# Patient Record
Sex: Female | Born: 1970 | Race: White | Hispanic: No | Marital: Single | Smoking: Never smoker
Health system: Southern US, Community
[De-identification: ages and names within clinical notes are randomized; demographics above are authoritative.]

## PROBLEM LIST (undated history)

## (undated) DIAGNOSIS — F32A Depression, unspecified: Secondary | ICD-10-CM

## (undated) DIAGNOSIS — L680 Hirsutism: Secondary | ICD-10-CM

## (undated) DIAGNOSIS — E785 Hyperlipidemia, unspecified: Secondary | ICD-10-CM

## (undated) DIAGNOSIS — E039 Hypothyroidism, unspecified: Secondary | ICD-10-CM

## (undated) DIAGNOSIS — F329 Major depressive disorder, single episode, unspecified: Secondary | ICD-10-CM

## (undated) DIAGNOSIS — J302 Other seasonal allergic rhinitis: Secondary | ICD-10-CM

## (undated) HISTORY — DX: Hirsutism: L68.0

## (undated) HISTORY — DX: Other seasonal allergic rhinitis: J30.2

## (undated) HISTORY — PX: NASAL SINUS SURGERY: SHX719

## (undated) HISTORY — DX: Depression, unspecified: F32.A

## (undated) HISTORY — DX: Hyperlipidemia, unspecified: E78.5

## (undated) HISTORY — DX: Major depressive disorder, single episode, unspecified: F32.9

## (undated) HISTORY — DX: Hypothyroidism, unspecified: E03.9

---

## 1999-02-17 ENCOUNTER — Encounter: Payer: Self-pay | Admitting: Family Medicine

## 1999-02-17 ENCOUNTER — Ambulatory Visit (HOSPITAL_COMMUNITY): Admission: RE | Admit: 1999-02-17 | Discharge: 1999-02-17 | Payer: Self-pay | Admitting: Family Medicine

## 2005-02-21 ENCOUNTER — Other Ambulatory Visit: Admission: RE | Admit: 2005-02-21 | Discharge: 2005-02-21 | Payer: Self-pay | Admitting: Obstetrics and Gynecology

## 2008-04-26 ENCOUNTER — Encounter: Admission: RE | Admit: 2008-04-26 | Discharge: 2008-04-26 | Payer: Self-pay | Admitting: Family Medicine

## 2009-05-05 ENCOUNTER — Ambulatory Visit: Payer: Self-pay | Admitting: Internal Medicine

## 2009-05-05 DIAGNOSIS — E785 Hyperlipidemia, unspecified: Secondary | ICD-10-CM | POA: Insufficient documentation

## 2009-05-05 DIAGNOSIS — F32A Depression, unspecified: Secondary | ICD-10-CM | POA: Insufficient documentation

## 2009-05-05 DIAGNOSIS — F329 Major depressive disorder, single episode, unspecified: Secondary | ICD-10-CM

## 2009-07-26 LAB — CONVERTED CEMR LAB: Pap Smear: NORMAL

## 2009-08-18 ENCOUNTER — Ambulatory Visit: Payer: Self-pay | Admitting: Internal Medicine

## 2009-08-18 DIAGNOSIS — E669 Obesity, unspecified: Secondary | ICD-10-CM | POA: Insufficient documentation

## 2009-08-22 LAB — CONVERTED CEMR LAB
ALT: 17 units/L (ref 0–35)
AST: 23 units/L (ref 0–37)
Albumin: 4.1 g/dL (ref 3.5–5.2)
Alkaline Phosphatase: 70 units/L (ref 39–117)
BUN: 14 mg/dL (ref 6–23)
Basophils Absolute: 0 10*3/uL (ref 0.0–0.1)
Basophils Relative: 0.5 % (ref 0.0–3.0)
Bilirubin, Direct: 0 mg/dL (ref 0.0–0.3)
CO2: 26 meq/L (ref 19–32)
Calcium: 9.4 mg/dL (ref 8.4–10.5)
Chloride: 103 meq/L (ref 96–112)
Cholesterol: 209 mg/dL — ABNORMAL HIGH (ref 0–200)
Creatinine, Ser: 0.8 mg/dL (ref 0.4–1.2)
Direct LDL: 130.5 mg/dL
Eosinophils Absolute: 0.3 10*3/uL (ref 0.0–0.7)
Eosinophils Relative: 5 % (ref 0.0–5.0)
GFR calc non Af Amer: 84.72 mL/min (ref >60)
Glucose, Bld: 89 mg/dL (ref 70–99)
HCT: 42 % (ref 36.0–46.0)
HDL: 67.1 mg/dL (ref >39.00)
Hemoglobin: 14 g/dL (ref 12.0–15.0)
Lymphocytes Relative: 16.8 % (ref 12.0–46.0)
Lymphs Abs: 1 10*3/uL (ref 0.7–4.0)
MCHC: 33.4 g/dL (ref 30.0–36.0)
MCV: 93.6 fL (ref 78.0–100.0)
Monocytes Absolute: 0.3 10*3/uL (ref 0.1–1.0)
Monocytes Relative: 5.9 % (ref 3.0–12.0)
Neutro Abs: 4.3 10*3/uL (ref 1.4–7.7)
Neutrophils Relative %: 71.8 % (ref 43.0–77.0)
Platelets: 277 10*3/uL (ref 150.0–400.0)
Potassium: 4.1 meq/L (ref 3.5–5.1)
RBC: 4.48 M/uL (ref 3.87–5.11)
RDW: 11.1 % — ABNORMAL LOW (ref 11.5–14.6)
Sodium: 138 meq/L (ref 135–145)
TSH: 3.09 microintl units/mL (ref 0.35–5.50)
Total Bilirubin: 0.3 mg/dL (ref 0.3–1.2)
Total CHOL/HDL Ratio: 3
Total Protein: 7.5 g/dL (ref 6.0–8.3)
Triglycerides: 159 mg/dL — ABNORMAL HIGH (ref 0.0–149.0)
VLDL: 31.8 mg/dL (ref 0.0–40.0)
WBC: 5.9 10*3/uL (ref 4.5–10.5)

## 2009-08-23 ENCOUNTER — Telehealth: Payer: Self-pay | Admitting: Internal Medicine

## 2009-09-15 ENCOUNTER — Encounter: Payer: Self-pay | Admitting: Internal Medicine

## 2010-04-24 ENCOUNTER — Telehealth: Payer: Self-pay | Admitting: Internal Medicine

## 2010-05-28 LAB — HM PAP SMEAR: HM Pap smear: NORMAL

## 2010-06-27 NOTE — Progress Notes (Signed)
Summary: refill  Phone Note Refill Request Call back at Home Phone [phone] Call back at Work Phone [phone] Message from:  Patient---live call  Refills Requested: Medication #1:  LIPITOR 20 MG TABS Take 1 tablet by mouth once a day  Medication #2:  SERTRALINE HCL 50 MG TABS Take 1 tablet by mouth once a day. [facility]  Initial call taken by: Warnell Forester,  April 24, 2010 4:10 PM  Follow-up for Phone Call        Rx called to pharmacy Follow-up by: Alfred Levins, CMA,  April 25, 2010 8:15 AM    Prescriptions: SERTRALINE HCL 50 MG TABS (SERTRALINE HCL) Take 1 tablet by mouth once a day  #90 x 0   Entered by:   Alfred Levins, CMA   Authorized by:   Birdie Sons MD   Signed by:   Alfred Levins, CMA on 04/25/2010   Method used:   Electronically to        [facility])       [facility]       [address]       [place], [place]  [address]       Ph: [phone]       Fax: [phone]   RxID:   [id number] LIPITOR 20 MG TABS (ATORVASTATIN CALCIUM) Take 1 tablet by mouth once a day  #90 x 0   Entered by:   Alfred Levins, CMA   Authorized by:   Birdie Sons MD   Signed by:   Alfred Levins, CMA on 04/25/2010   Method used:   Electronically to        [facility] ([facility]order)       [facility]       [address]       [place], [place]  [address]       Ph: [phone]       Fax: [phone]   RxID:   [id number]

## 2010-06-27 NOTE — Progress Notes (Signed)
Summary: request  Phone Note Call from Patient Call back at Home Phone [phone] Call back at Work Phone [phone]   Caller: vm Mon 3:59 Call For: ellen Summary of Call: Returning your call.  Specific questions about labs & referral to [facility] that Dr. Armanda Magic said he'd do for thyroid.   Initial call taken by: Rudy Jew, RN,  August 23, 2009 8:25 AM  Follow-up for Phone Call        Requested specific lab values which were relayed.  She also requests referral to Dr Talmage Nap at [facility]. Follow-up by: Gladis Riffle, RN,  August 23, 2009 9:40 AM  Additional Follow-up for Phone Call Additional follow up Details #1::        she can call for own appt... we will send labs Additional Follow-up by: Birdie Sons MD,  August 23, 2009 9:54 AM    Additional Follow-up for Phone Call Additional follow up Details #2::    Patient notified.   Labs to be faxed. Follow-up by: Gladis Riffle, RN,  August 23, 2009 12:01 PM  Additional Follow-up for Phone Call Additional follow up Details #3:: Details for Additional Follow-up Action Taken: pt called back and says for appt we need to send ov notes, labs, demographics, and insurance info faxed  to attn: jessie at [phone]  ok per dr swords to share info but no referral indicated.Patient notified.   Info will be faxed as requested. Additional Follow-up by: Gladis Riffle, RN,  August 23, 2009 2:48 PM

## 2010-06-27 NOTE — Consult Note (Signed)
Summary: [facility]   Imported By: Maryln Gottron 10/18/2009 10:53:09  _____________________________________________________________________  External Attachment:    Type:   Image     Comment:   External Document

## 2010-06-27 NOTE — Assessment & Plan Note (Signed)
Summary: cpx/no pap/njr/will fast for v70.0//ccm/pt rescd from bump/ccm   Vital Signs:  Patient profile:   40 year old female Menstrual status:  regular LMP:     08/09/2009 Height:      63 inches Weight:      218 pounds BMI:     38.76 Temp:     98.3 degrees F oral BP sitting:   120 / 80  (left arm) Cuff size:   large  Vitals Entered By: Kern Reap CMA Duncan Dull) (August 18, 2009 9:57 AM)  Nutrition Counseling: Patient's BMI is greater than 25 and therefore counseled on weight management options. CC: yearly physical Is Patient Diabetic? No LMP (date): 08/09/2009     Menstrual Status regular Enter LMP: 08/09/2009 Last PAP Result normal-pt's report   CC:  yearly physical.  History of Present Illness: cpx  Current Problems (verified): 1)  Hyperlipidemia  (ICD-272.4) 2)  Depression  (ICD-311)  Current Medications (verified): 1)  Lipitor 20 Mg Tabs (Atorvastatin Calcium) .... Take 1 Tablet By Mouth Once A Day 2)  Sertraline Hcl 50 Mg Tabs (Sertraline Hcl) .... Take 1 Tablet By Mouth Once A Day  Allergies: 1)  ! 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   Head:  normocephalic and atraumatic.   Eyes:  pupils equal and pupils round.   Ears:  R ear normal and L ear normal.   Neck:  No deformities, masses, or  tenderness noted. Chest Wall:  No deformities, masses, or tenderness noted. Lungs:  normal respiratory effort and no intercostal retractions.   Heart:  normal rate and regular rhythm.   Abdomen:  soft and non-tender.  overweight Genitalia:  GYN Msk:  normal ROM and no joint tenderness.   Neurologic:  cranial nerves II-XII intact and gait normal.   Skin:  turgor normal and color normal.   Psych:  normally interactive and good eye contact.     Impression & Recommendations:  Problem # 1:  PREVENTIVE HEALTH CARE (ICD-V70.0)  Health Maint UTd immunizations reviewed discussed need for aggressive weight loss daily exercise low calorie diet  Orders: UA Dipstick w/o Micro (automated)  (81003) Venipuncture (08657) TLB-Lipid Panel (80061-LIPID) TLB-BMP (Basic Metabolic Panel-BMET) (80048-METABOL) TLB-CBC Platelet - w/Differential (85025-CBCD) TLB-Hepatic/Liver Function Pnl (80076-HEPATIC) TLB-TSH (Thyroid Stimulating Hormone) (84443-TSH)  Problem # 2:  HYPERLIPIDEMIA (ICD-272.4) check labs today Her updated medication list for this problem includes:    Lipitor 20 Mg Tabs (Atorvastatin calcium) .Marland Kitchen... Take 1 tablet by mouth once a day  Problem # 3:  OBESITY (ICD-278.00) this is clearly her most significant problem discussed diet , exercise and aggressive weight loss goal BMI < 25  Complete Medication List: 1)  Lipitor 20 Mg Tabs (Atorvastatin calcium) .... Take 1 tablet by mouth once a day 2)  Sertraline Hcl 50 Mg Tabs (Sertraline hcl) .... Take 1 tablet by mouth once a day  Other Orders: Tdap => 31yrs IM (84696) Admin 1st Vaccine (29528)   Immunizations  Administered:  Tetanus Vaccine:    Vaccine Type: Tdap    Site: left deltoid    Mfr: Sanofi Pasteur    Dose: 0.5 ml    Route: IM    Given by: Kern Reap CMA (AAMA)    Exp. Date: 03/23/2010    Lot #: [id number]    Physician counseled: yes    Preventive Care Screening  Pap Smear:    Date:  07/26/2009    Next  Due:  07/2012    Results:  normal-pt's report   Last Tetanus Booster:    Date:  08/18/2009    Results:  Tdap   Appended Document: Orders Update    Clinical Lists Changes  Observations: Added new observation of COMMENTS: Wynona Canes, CMA  August 18, 2009 1:58 PM (08/18/2009 13:56) Added new observation of PH URINE: 6.0  (08/18/2009 13:56) Added new observation of SPEC GR URIN: <1.005  (08/18/2009 13:56) Added new observation of APPEARANCE U: Clear  (08/18/2009 13:56) Added new observation of UA COLOR: yellow  (08/18/2009 13:56) Added new observation of WBC DIPSTK U: negative  (08/18/2009 13:56) Added new observation of NITRITE URN: negative  (08/18/2009 13:56) Added new observation of UROBILINOGEN: negative  (08/18/2009 13:56) Added new observation of PROTEIN, URN: negative  (08/18/2009 13:56) Added new observation of BLOOD UR DIP: negative  (08/18/2009 13:56) Added new observation of KETONES URN: negative  (08/18/2009 13:56) Added new observation of BILIRUBIN UR: negative  (08/18/2009 13:56) Added new observation of GLUCOSE, URN: negative  (08/18/2009 13:56)      Laboratory Results   Urine Tests  Date/Time Recieved: August 18, 2009 1:58 PM Date/Time Reported: August 18, 2009 1:58 PM  Routine Urinalysis   Color: yellow Appearance: Clear Glucose: negative   (Normal Range: Negative) Bilirubin: negative   (Normal Range: Negative) Ketone: negative   (Normal Range: Negative) Spec. Gravity: <1.005   (Normal Range: 1.003-1.035) Blood: negative   (Normal Range: Negative) pH: 6.0   (Normal Range: 5.0-8.0) Protein: negative   (Normal Range: Negative) Urobilinogen: negative   (Normal Range: 0-1) Nitrite: negative   (Normal Range: Negative) Leukocyte Esterace: negative   (Normal Range: Negative)    Comments: Wynona Canes, CMA  August 18, 2009 1:58 PM

## 2010-08-24 ENCOUNTER — Telehealth: Payer: Self-pay | Admitting: Internal Medicine

## 2010-08-24 DIAGNOSIS — F3289 Other specified depressive episodes: Secondary | ICD-10-CM

## 2010-08-24 DIAGNOSIS — F329 Major depressive disorder, single episode, unspecified: Secondary | ICD-10-CM

## 2010-08-24 MED ORDER — SERTRALINE HCL 50 MG PO TABS: 50.0000 mg | ORAL_TABLET | Freq: Every day | ORAL | Status: DC

## 2010-08-24 MED ORDER — ATORVASTATIN CALCIUM 20 MG PO TABS: 20.0000 mg | ORAL_TABLET | Freq: Every day | ORAL | Status: DC

## 2010-08-24 NOTE — Telephone Encounter (Signed)
Can give one 90 day refill---she needs to schedule CPX with labs

## 2010-08-24 NOTE — Telephone Encounter (Signed)
Pt called and is needing to get a script for generic zoloft 50 mg and Lipitor 20mg . 90 day supply on both meds to [facility].

## 2010-08-24 NOTE — Telephone Encounter (Signed)
rx sent in electronically, appt scheduled

## 2010-08-25 ENCOUNTER — Other Ambulatory Visit: Payer: Self-pay | Admitting: *Deleted

## 2010-08-25 DIAGNOSIS — F329 Major depressive disorder, single episode, unspecified: Secondary | ICD-10-CM

## 2010-08-25 DIAGNOSIS — F3289 Other specified depressive episodes: Secondary | ICD-10-CM

## 2010-09-22 ENCOUNTER — Encounter: Payer: Self-pay | Admitting: Internal Medicine

## 2010-10-05 ENCOUNTER — Encounter: Payer: Self-pay | Admitting: Internal Medicine

## 2010-10-06 ENCOUNTER — Ambulatory Visit (INDEPENDENT_AMBULATORY_CARE_PROVIDER_SITE_OTHER): Payer: BC Managed Care – PPO | Admitting: Internal Medicine

## 2010-10-06 ENCOUNTER — Encounter: Payer: Self-pay | Admitting: Internal Medicine

## 2010-10-06 VITALS — BP 112/84 | HR 72 | Temp 98.1°F | Ht 63.0 in | Wt 160.0 lb

## 2010-10-06 DIAGNOSIS — E785 Hyperlipidemia, unspecified: Secondary | ICD-10-CM

## 2010-10-06 DIAGNOSIS — Z0000 Encounter for general adult medical examination without abnormal findings: Secondary | ICD-10-CM

## 2010-10-06 LAB — CBC WITH DIFFERENTIAL/PLATELET
Basophils Absolute: 0 10*3/uL (ref 0.0–0.1)
Basophils Relative: 0.3 % (ref 0.0–3.0)
Eosinophils Absolute: 0.3 10*3/uL (ref 0.0–0.7)
Eosinophils Relative: 5.2 % — ABNORMAL HIGH (ref 0.0–5.0)
HCT: 39.5 % (ref 36.0–46.0)
Hemoglobin: 13.6 g/dL (ref 12.0–15.0)
Lymphocytes Relative: 18.6 % (ref 12.0–46.0)
Lymphs Abs: 0.9 10*3/uL (ref 0.7–4.0)
MCHC: 34.4 g/dL (ref 30.0–36.0)
MCV: 94.7 fl (ref 78.0–100.0)
Monocytes Absolute: 0.4 10*3/uL (ref 0.1–1.0)
Monocytes Relative: 7.6 % (ref 3.0–12.0)
Neutro Abs: 3.4 10*3/uL (ref 1.4–7.7)
Neutrophils Relative %: 68.3 % (ref 43.0–77.0)
Platelets: 242 10*3/uL (ref 150.0–400.0)
RBC: 4.18 Mil/uL (ref 3.87–5.11)
RDW: 12.2 % (ref 11.5–14.6)
WBC: 5 10*3/uL (ref 4.5–10.5)

## 2010-10-06 LAB — BASIC METABOLIC PANEL
BUN: 13 mg/dL (ref 6–23)
CO2: 25 mEq/L (ref 19–32)
Calcium: 9.3 mg/dL (ref 8.4–10.5)
Chloride: 104 mEq/L (ref 96–112)
Creatinine, Ser: 0.8 mg/dL (ref 0.4–1.2)
GFR: 90.75 mL/min (ref >60.00)
Glucose, Bld: 80 mg/dL (ref 70–99)
Potassium: 4 mEq/L (ref 3.5–5.1)
Sodium: 136 mEq/L (ref 135–145)

## 2010-10-06 LAB — HEPATIC FUNCTION PANEL
ALT: 13 U/L (ref 0–35)
AST: 21 U/L (ref 0–37)
Albumin: 4.2 g/dL (ref 3.5–5.2)
Alkaline Phosphatase: 59 U/L (ref 39–117)
Bilirubin, Direct: 0.1 mg/dL (ref 0.0–0.3)
Total Bilirubin: 0.6 mg/dL (ref 0.3–1.2)
Total Protein: 7 g/dL (ref 6.0–8.3)

## 2010-10-06 LAB — LIPID PANEL
Cholesterol: 162 mg/dL (ref 0–200)
HDL: 67.4 mg/dL (ref >39.00)
LDL Cholesterol: 84 mg/dL (ref 0–99)
Total CHOL/HDL Ratio: 2
Triglycerides: 55 mg/dL (ref 0.0–149.0)
VLDL: 11 mg/dL (ref 0.0–40.0)

## 2010-10-06 LAB — TSH: TSH: 2.67 u[IU]/mL (ref 0.35–5.50)

## 2010-10-06 NOTE — Progress Notes (Signed)
Addended by: Lindley Magnus on: 10/06/2010 09:36 AM   Modules accepted: Orders

## 2010-10-06 NOTE — Progress Notes (Signed)
  Subjective:    Patient ID: Andrea Delgado, female    DOB: [birth year], 40 y.o.   MRN: [id number]  HPI  Cpx She has lost a tremendous amount of weight Feels great  Past Medical History  Diagnosis Date  . Depression   . Hyperlipidemia    No past surgical history on file.  reports that she has never smoked. She does not have any smokeless tobacco history on file. She reports that she drinks alcohol. Her drug history not on file. family history includes Alcohol abuse in her mother; Cirrhosis in her mother; and Heart disease in her father. Allergies  Allergen Reactions  . Penicillins     REACTION: rash     Review of Systems  patient denies chest pain, shortness of breath, orthopnea. Denies lower extremity edema, abdominal pain, change in appetite, change in bowel movements. Patient denies rashes, musculoskeletal complaints. No other specific complaints in a complete review of systems.      Objective:   Physical Exam  Well-developed well-nourished female in no acute distress. HEENT exam atraumatic, normocephalic, extraocular muscles are intact. Neck is supple. No jugular venous distention no thyromegaly. Chest clear to auscultation without increased work of breathing. Cardiac exam S1 and S2 are regular. Abdominal exam active bowel sounds, soft, nontender. Extremities no edema. Neurologic exam she is alert without any motor sensory deficits. Gait is normal.        Assessment & Plan:  Well visit Health miant utd Congratulated on tremendous weight loss

## 2010-12-06 ENCOUNTER — Other Ambulatory Visit: Payer: Self-pay | Admitting: Internal Medicine

## 2010-12-06 DIAGNOSIS — F3289 Other specified depressive episodes: Secondary | ICD-10-CM

## 2010-12-06 DIAGNOSIS — F329 Major depressive disorder, single episode, unspecified: Secondary | ICD-10-CM

## 2010-12-06 MED ORDER — SERTRALINE HCL 50 MG PO TABS: 50.0000 mg | ORAL_TABLET | Freq: Every day | ORAL | Status: DC

## 2010-12-06 NOTE — Telephone Encounter (Signed)
Pt req 90 day supply of Sertraline 50 mg to [facility].

## 2010-12-06 NOTE — Telephone Encounter (Signed)
rx sent in electronically 

## 2011-02-21 ENCOUNTER — Telehealth: Payer: Self-pay | Admitting: Internal Medicine

## 2011-02-21 NOTE — Telephone Encounter (Signed)
Pt called again . Requesting to be called when rx is called in

## 2011-02-21 NOTE — Telephone Encounter (Signed)
Pt went to [facility] this past wkend for sinus inf. Pt was prescribed a 5 day antibiotic,Levofloxacin 500 mg. Pt is still not completely well and is req a refill for this med and also a nasal spray ie flonase or nasonex. [facility]s [facility] to [facility]. Pt can not take any more time off work for ov.

## 2011-02-21 NOTE — Telephone Encounter (Signed)
Pt needs to be seen by another physician

## 2011-02-21 NOTE — Telephone Encounter (Signed)
Pt called back again to check on status of getting Levofloxacin called in to [facility] and also a nasal spray, ie flonase or nasonex.

## 2011-02-22 MED ORDER — FLUTICASONE PROPIONATE 50 MCG/ACT NA SUSP: 1.0000 | Freq: Every day | NASAL | Status: DC

## 2011-02-22 NOTE — Telephone Encounter (Signed)
Pt called back and was upset that she had to see another physician.  She does not want to take the time off work or pay another copay.  Talked to Dr Cato Mulligan and per Dr Cato Mulligan, pt received enough of antibiotics but it is ok to call in Flonase. Pt aware and request it be called [facility] [address], rx sent in electronically

## 2011-07-26 ENCOUNTER — Encounter: Payer: Self-pay | Admitting: Family

## 2011-07-26 ENCOUNTER — Ambulatory Visit (INDEPENDENT_AMBULATORY_CARE_PROVIDER_SITE_OTHER): Payer: BC Managed Care – PPO | Admitting: Family

## 2011-07-26 VITALS — BP 112/70 | Temp 98.4°F | Ht 63.0 in | Wt 148.0 lb

## 2011-07-26 DIAGNOSIS — R059 Cough, unspecified: Secondary | ICD-10-CM

## 2011-07-26 DIAGNOSIS — J0100 Acute maxillary sinusitis, unspecified: Secondary | ICD-10-CM

## 2011-07-26 DIAGNOSIS — R05 Cough: Secondary | ICD-10-CM

## 2011-07-26 MED ORDER — AZITHROMYCIN 250 MG PO TABS: ORAL_TABLET | ORAL | Status: AC

## 2011-07-26 NOTE — Patient Instructions (Signed)

## 2011-07-26 NOTE — Progress Notes (Signed)
  Subjective:    Patient ID: Andrea Delgado, female    DOB: 12-Feb-1971, 41 y.o.   MRN: [id number]  HPI 41 year old white female, nonsmoker, patient of Dr. Cato Mulligan is in today with complaints of swollen glands, sore throat, cough, congestion, and fatigue for 5 days. Significant over-the-counter cold and cough medication and using Flonase with no relief. She denies any fever, muscle aches or pains, lightheadedness, dizziness, shortness of breath or edema.   Review of Systems  Constitutional: Positive for fatigue.  HENT: Positive for congestion, sore throat, sneezing, postnasal drip and sinus pressure.   Respiratory: Negative.   Cardiovascular: Negative.   Gastrointestinal: Negative.   Musculoskeletal: Negative.   Skin: Negative.   Neurological: Negative.   Hematological: Negative.   Psychiatric/Behavioral: Negative.        Objective:   Physical Exam  Constitutional: She is oriented to person, place, and time. She appears well-developed and well-nourished.  HENT:  Right Ear: External ear normal.  Left Ear: External ear normal.  Nose: Nose normal.  Mouth/Throat: Oropharynx is clear and moist.  Neck: Normal range of motion. Neck supple.  Cardiovascular: Normal heart sounds and intact distal pulses.   Pulmonary/Chest: Effort normal and breath sounds normal.  Abdominal: Soft. Bowel sounds are normal.  Musculoskeletal: Normal range of motion.  Neurological: She is alert and oriented to person, place, and time.  Skin: Skin is warm and dry.  Psychiatric: She has a normal mood and affect.          Assessment & Plan:  Assessment: Acute sinusitis, cough  Plan: Z-Pak as directed. Continue over-the-counter symptomatic treatment for relief. Rest. Drink plenty of fluids. Patient on the opposite symptoms worsen or persist for recheck as scheduled or when necessary.

## 2011-08-21 ENCOUNTER — Encounter: Payer: Self-pay | Admitting: Family Medicine

## 2011-08-21 ENCOUNTER — Ambulatory Visit (INDEPENDENT_AMBULATORY_CARE_PROVIDER_SITE_OTHER): Payer: BC Managed Care – PPO | Admitting: Family Medicine

## 2011-08-21 VITALS — BP 108/66 | HR 97 | Temp 98.9°F | Wt 154.0 lb

## 2011-08-21 DIAGNOSIS — J329 Chronic sinusitis, unspecified: Secondary | ICD-10-CM

## 2011-08-21 MED ORDER — LEVOFLOXACIN 500 MG PO TABS: 500.0000 mg | ORAL_TABLET | Freq: Every day | ORAL | Status: AC

## 2011-08-21 NOTE — Progress Notes (Signed)
  Subjective:    Patient ID: Andrea Delgado, female    DOB: [birth year], 41 y.o.   MRN: [id number]  HPI Here for 5 days of sinus pressure, PND, ST, and a dry cough. No fever. She was here last month for a similar bout and took a Zpack.    Review of Systems  Constitutional: Negative.   HENT: Positive for congestion, postnasal drip and sinus pressure.   Eyes: Negative.   Respiratory: Positive for cough.        Objective:   Physical Exam  Constitutional: She appears well-developed and well-nourished.  HENT:  Right Ear: External ear normal.  Left Ear: External ear normal.  Nose: Nose normal.  Mouth/Throat: Oropharynx is clear and moist. No oropharyngeal exudate.  Eyes: Conjunctivae are normal.  Pulmonary/Chest: Effort normal and breath sounds normal.  Lymphadenopathy:    She has no cervical adenopathy.          Assessment & Plan:  Add Mucinex

## 2011-08-29 ENCOUNTER — Telehealth: Payer: Self-pay | Admitting: Internal Medicine

## 2011-08-29 NOTE — Telephone Encounter (Signed)
Pls advise.  

## 2011-08-29 NOTE — Telephone Encounter (Addendum)
Pt called back again to check on status of getting refill of the Levofloaxcin. Pls call in asap. Pt said that she would like a call back before 5pm today, so she will know if the abx has been called in or not.

## 2011-08-29 NOTE — Telephone Encounter (Signed)
No refill is necessary. If symptoms persist, see PCP

## 2011-08-29 NOTE — Telephone Encounter (Signed)
Pt called and said that she has been taking the levofloxacin (LEVAQUIN) 500 MG tablet with her vitamins, but just read that she was suppose to take it 2 hrs prior or 2 hrs after taking vitamins. Pt is concerned that this has ruined the effectiveness of abx. Pt still has congestion and green nasal discharge. Pt doesn't know if she needs another round of abx or not? Pt does not want to sch another ov. [facility] [address] Pt wants to be notified.

## 2011-08-29 NOTE — Telephone Encounter (Signed)
Pt aware.

## 2011-10-01 ENCOUNTER — Other Ambulatory Visit: Payer: Self-pay | Admitting: *Deleted

## 2011-10-01 ENCOUNTER — Telehealth: Payer: Self-pay | Admitting: Internal Medicine

## 2011-10-01 DIAGNOSIS — F3289 Other specified depressive episodes: Secondary | ICD-10-CM

## 2011-10-01 DIAGNOSIS — F329 Major depressive disorder, single episode, unspecified: Secondary | ICD-10-CM

## 2011-10-01 MED ORDER — SERTRALINE HCL 50 MG PO TABS: 50.0000 mg | ORAL_TABLET | Freq: Every day | ORAL | Status: DC

## 2011-10-01 NOTE — Telephone Encounter (Signed)
Pt called req to get a refill of sertraline (ZOLOFT) 50 MG tablet [facility] [address]. Pt is completely out of med.

## 2011-10-01 NOTE — Telephone Encounter (Signed)
rx sent in electronically 

## 2011-10-08 ENCOUNTER — Ambulatory Visit (INDEPENDENT_AMBULATORY_CARE_PROVIDER_SITE_OTHER): Payer: BC Managed Care – PPO | Admitting: Internal Medicine

## 2011-10-08 ENCOUNTER — Encounter: Payer: Self-pay | Admitting: Internal Medicine

## 2011-10-08 VITALS — BP 124/82 | HR 68 | Temp 98.3°F | Ht 64.0 in | Wt 159.0 lb

## 2011-10-08 DIAGNOSIS — L309 Dermatitis, unspecified: Secondary | ICD-10-CM

## 2011-10-08 DIAGNOSIS — L680 Hirsutism: Secondary | ICD-10-CM | POA: Insufficient documentation

## 2011-10-08 DIAGNOSIS — Z0000 Encounter for general adult medical examination without abnormal findings: Secondary | ICD-10-CM

## 2011-10-08 DIAGNOSIS — L259 Unspecified contact dermatitis, unspecified cause: Secondary | ICD-10-CM

## 2011-10-08 LAB — BASIC METABOLIC PANEL
BUN: 20 mg/dL (ref 6–23)
CO2: 24 mEq/L (ref 19–32)
Calcium: 8.6 mg/dL (ref 8.4–10.5)
Chloride: 97 mEq/L (ref 96–112)
Creatinine, Ser: 0.6 mg/dL (ref 0.4–1.2)
GFR: 108.44 mL/min (ref >60.00)
Glucose, Bld: 77 mg/dL (ref 70–99)
Potassium: 3.8 mEq/L (ref 3.5–5.1)
Sodium: 133 mEq/L — ABNORMAL LOW (ref 135–145)

## 2011-10-08 LAB — LIPID PANEL
Cholesterol: 199 mg/dL (ref 0–200)
HDL: 89.4 mg/dL (ref >39.00)
LDL Cholesterol: 93 mg/dL (ref 0–99)
Total CHOL/HDL Ratio: 2
Triglycerides: 81 mg/dL (ref 0.0–149.0)
VLDL: 16.2 mg/dL (ref 0.0–40.0)

## 2011-10-08 LAB — CBC WITH DIFFERENTIAL/PLATELET
Basophils Absolute: 0 10*3/uL (ref 0.0–0.1)
Basophils Relative: 0.3 % (ref 0.0–3.0)
Eosinophils Absolute: 0.3 10*3/uL (ref 0.0–0.7)
Eosinophils Relative: 5.1 % — ABNORMAL HIGH (ref 0.0–5.0)
HCT: 40.2 % (ref 36.0–46.0)
Hemoglobin: 13.2 g/dL (ref 12.0–15.0)
Lymphocytes Relative: 16.8 % (ref 12.0–46.0)
Lymphs Abs: 0.9 10*3/uL (ref 0.7–4.0)
MCHC: 33 g/dL (ref 30.0–36.0)
MCV: 95.3 fl (ref 78.0–100.0)
Monocytes Absolute: 0.4 10*3/uL (ref 0.1–1.0)
Monocytes Relative: 7.4 % (ref 3.0–12.0)
Neutro Abs: 3.8 10*3/uL (ref 1.4–7.7)
Neutrophils Relative %: 70.4 % (ref 43.0–77.0)
Platelets: 250 10*3/uL (ref 150.0–400.0)
RBC: 4.22 Mil/uL (ref 3.87–5.11)
RDW: 12.9 % (ref 11.5–14.6)
WBC: 5.4 10*3/uL (ref 4.5–10.5)

## 2011-10-08 LAB — HEPATIC FUNCTION PANEL
ALT: 13 U/L (ref 0–35)
AST: 22 U/L (ref 0–37)
Albumin: 4 g/dL (ref 3.5–5.2)
Alkaline Phosphatase: 45 U/L (ref 39–117)
Bilirubin, Direct: 0 mg/dL (ref 0.0–0.3)
Total Bilirubin: 0.4 mg/dL (ref 0.3–1.2)
Total Protein: 7.2 g/dL (ref 6.0–8.3)

## 2011-10-08 LAB — POCT URINALYSIS DIPSTICK
Bilirubin, UA: NEGATIVE
Glucose, UA: NEGATIVE
Ketones, UA: NEGATIVE
Leukocytes, UA: NEGATIVE
Nitrite, UA: NEGATIVE
Protein, UA: NEGATIVE
Spec Grav, UA: <=1.005
Urobilinogen, UA: 0.2
pH, UA: 7

## 2011-10-08 LAB — TSH: TSH: 2.38 u[IU]/mL (ref 0.35–5.50)

## 2011-10-08 MED ORDER — CLOBETASOL PROPIONATE 0.05 % EX SOLN: 1.0000 "application " | Freq: Two times a day (BID) | CUTANEOUS | Status: AC

## 2011-10-08 NOTE — Progress Notes (Signed)
This encounter was created in error - please disregard.

## 2011-10-08 NOTE — Progress Notes (Signed)
Patient ID: Andrea Delgado, female   DOB: [birth year], 41 y.o.   MRN: [id number] cpx  Past Medical History  Diagnosis Date  . Depression   . Hyperlipidemia     History   Social History  . Marital Status: Single    Spouse Name: N/A    Number of Children: N/A  . Years of Education: N/A   Occupational History  . Not on file.   Social History Main Topics  . Smoking status: Never Smoker   . Smokeless tobacco: Never Used  . Alcohol Use: No  . Drug Use: No  . Sexually Active: Not on file   Other Topics Concern  . Not on file   Social History Narrative  . No narrative on file    No past surgical history on file.  Family History  Problem Relation Age of Onset  . Alcohol abuse Mother   . Cirrhosis Mother   . Heart disease Father     Allergies  Allergen Reactions  . Penicillins     REACTION: rash    Current Outpatient Prescriptions on File Prior to Visit  Medication Sig Dispense Refill  . fluticasone (FLONASE) 50 MCG/ACT nasal spray Place 1 spray into the nose daily.  16 g  2  . sertraline (ZOLOFT) 50 MG tablet Take 1 tablet (50 mg total) by mouth daily.  30 tablet  0  . spironolactone (ALDACTONE) 25 MG tablet Take 25 mg by mouth daily.        Marland Kitchen DISCONTD: atorvastatin (LIPITOR) 20 MG tablet Take 1 tablet (20 mg total) by mouth daily.  30 tablet  11     patient denies chest pain, shortness of breath, orthopnea. Denies lower extremity edema, abdominal pain, change in appetite, change in bowel movements. Patient denies rashes, musculoskeletal complaints. No other specific complaints in a complete review of systems.   BP 124/82  Pulse 68  Temp(Src) 98.3 F (36.8 C) (Oral)  Ht 5\' 4"  (1.626 m)  Wt 159 lb (72.122 kg)  BMI 27.29 kg/m2  Well-developed well-nourished female in no acute distress. HEENT exam atraumatic, normocephalic, extraocular muscles are intact. Neck is supple. No jugular venous distention no thyromegaly. Chest clear to auscultation without increased  work of breathing. Cardiac exam S1 and S2 are regular. Abdominal exam active bowel sounds, soft, nontender. Extremities no edema. Neurologic exam she is alert without any motor sensory deficits. Gait is normal. Dry/scaley skin around scalp and preauricular area  A/P-- well visit- health maint utd

## 2011-10-12 NOTE — Progress Notes (Signed)
Quick Note:  Pt informed ______ 

## 2011-10-18 ENCOUNTER — Telehealth: Payer: Self-pay | Admitting: Internal Medicine

## 2011-10-18 DIAGNOSIS — F329 Major depressive disorder, single episode, unspecified: Secondary | ICD-10-CM

## 2011-10-18 DIAGNOSIS — F3289 Other specified depressive episodes: Secondary | ICD-10-CM

## 2011-10-18 MED ORDER — SERTRALINE HCL 50 MG PO TABS: 50.0000 mg | ORAL_TABLET | Freq: Every day | ORAL | Status: DC

## 2011-10-18 NOTE — Telephone Encounter (Signed)
rx sent in electronically 

## 2011-10-18 NOTE — Telephone Encounter (Signed)
Pt req 90 day supply of sertraline (ZOLOFT) 50 MG tablet to be sent into [facility].

## 2012-02-04 ENCOUNTER — Telehealth: Payer: Self-pay | Admitting: Internal Medicine

## 2012-02-04 MED ORDER — ATORVASTATIN CALCIUM 20 MG PO TABS: 20.0000 mg | ORAL_TABLET | Freq: Every day | ORAL | Status: DC

## 2012-02-04 NOTE — Telephone Encounter (Signed)
Pt requesting refill on atorvastatin (LIPITOR) 20 MG tablet   [facility]

## 2012-02-04 NOTE — Telephone Encounter (Signed)
rx sent in electronically 

## 2012-03-19 ENCOUNTER — Other Ambulatory Visit: Payer: Self-pay | Admitting: Internal Medicine

## 2012-05-23 ENCOUNTER — Ambulatory Visit (INDEPENDENT_AMBULATORY_CARE_PROVIDER_SITE_OTHER): Payer: BC Managed Care – PPO | Admitting: Family

## 2012-05-23 ENCOUNTER — Encounter: Payer: Self-pay | Admitting: Family

## 2012-05-23 VITALS — BP 126/80 | HR 64 | Temp 97.8°F | Wt 174.0 lb

## 2012-05-23 DIAGNOSIS — J019 Acute sinusitis, unspecified: Secondary | ICD-10-CM

## 2012-05-23 DIAGNOSIS — R05 Cough: Secondary | ICD-10-CM

## 2012-05-23 DIAGNOSIS — R059 Cough, unspecified: Secondary | ICD-10-CM

## 2012-05-23 MED ORDER — LEVOFLOXACIN 500 MG PO TABS: 500.0000 mg | ORAL_TABLET | Freq: Every day | ORAL | Status: DC

## 2012-05-23 NOTE — Patient Instructions (Addendum)

## 2012-05-26 NOTE — Progress Notes (Signed)
  Subjective:    Patient ID: Andrea Delgado, female    DOB: July 27, 1970, 41 y.o.   MRN: [id number]  HPI 41 year old female, nonsmoker, sneezing, cough, congestion, sore throat, sinus pressure/pain x 1 month. Has been taking Mucinex and Advil that helps but symptoms never clear.    Review of Systems  Constitutional: Positive for fatigue.  HENT: Positive for congestion, rhinorrhea, sneezing, postnasal drip and sinus pressure.   Eyes: Negative.   Respiratory: Positive for cough. Negative for shortness of breath and wheezing.   Cardiovascular: Negative.   Musculoskeletal: Negative.   Skin: Negative.   Neurological: Negative.   Hematological: Negative.   Psychiatric/Behavioral: Negative.    Past Medical History  Diagnosis Date  . Depression   . Hyperlipidemia     History   Social History  . Marital Status: Single    Spouse Name: N/A    Number of Children: N/A  . Years of Education: N/A   Occupational History  . Not on file.   Social History Main Topics  . Smoking status: Never Smoker   . Smokeless tobacco: Never Used  . Alcohol Use: No  . Drug Use: No  . Sexually Active: Not on file   Other Topics Concern  . Not on file   Social History Narrative  . No narrative on file    No past surgical history on file.  Family History  Problem Relation Age of Onset  . Alcohol abuse Mother   . Cirrhosis Mother   . Heart disease Father     Allergies  Allergen Reactions  . Penicillins     REACTION: rash    Current Outpatient Prescriptions on File Prior to Visit  Medication Sig Dispense Refill  . atorvastatin (LIPITOR) 20 MG tablet Take 1 tablet (20 mg total) by mouth daily.  90 tablet  3  . clobetasol (TEMOVATE) 0.05 % external solution Apply 1 application topically 2 (two) times daily.  50 mL  0  . fluticasone (FLONASE) 50 MCG/ACT nasal spray USE 1 SPRAY IN THE NOSE DAILY.  16 g  5  . sertraline (ZOLOFT) 50 MG tablet Take 1 tablet (50 mg total) by mouth daily.  90  tablet  3  . spironolactone (ALDACTONE) 25 MG tablet Take 25 mg by mouth daily.          BP 126/80  Pulse 64  Temp 97.8 F (36.6 C) (Oral)  Wt 174 lb (78.926 kg)  SpO2 98%chart    Objective:   Physical Exam  Constitutional: She is oriented to person, place, and time. She appears well-developed and well-nourished.  HENT:  Right Ear: External ear normal.  Left Ear: External ear normal.  Nose: Nose normal.  Mouth/Throat: Oropharynx is clear and moist.       Sinus tenderness to palpation of the maxillary  Neck: Normal range of motion. Neck supple.  Cardiovascular: Normal rate, regular rhythm and normal heart sounds.   Pulmonary/Chest: Effort normal and breath sounds normal.  Neurological: She is alert and oriented to person, place, and time.  Skin: Skin is warm and dry.  Psychiatric: She has a normal mood and affect.          Assessment & Plan:  Assessment: Sinusitis, Cough  Plan:Call the office if symptoms worsen or persist. Recheck as scheduled and as needed.

## 2012-07-01 ENCOUNTER — Encounter: Payer: Self-pay | Admitting: Family

## 2012-07-01 ENCOUNTER — Ambulatory Visit (INDEPENDENT_AMBULATORY_CARE_PROVIDER_SITE_OTHER): Payer: BC Managed Care – PPO | Admitting: Family

## 2012-07-01 VITALS — BP 108/58 | HR 97 | Temp 97.5°F | Ht 64.0 in | Wt 186.0 lb

## 2012-07-01 DIAGNOSIS — R059 Cough, unspecified: Secondary | ICD-10-CM

## 2012-07-01 DIAGNOSIS — R05 Cough: Secondary | ICD-10-CM

## 2012-07-01 DIAGNOSIS — J309 Allergic rhinitis, unspecified: Secondary | ICD-10-CM

## 2012-07-01 MED ORDER — PREDNISONE 20 MG PO TABS: ORAL_TABLET | ORAL | Status: AC

## 2012-07-01 NOTE — Progress Notes (Signed)
Subjective:    Patient ID: Andrea Delgado, female    DOB: [birth year], 42 y.o.   MRN: [id number]  HPI  42 year old white female, nonsmoker, patient of Dr. Cato Mulligan is in today with complaints of cough, congestion, sinus pressure x1 week and worsening. She's been treated for a sinus infection twice in the last 6 months. Patient reports having continual drainage and doesn't think that she ever really completely cleared from her visit in December. She continues to use Flonase 2 sprays in each nostril in today. Denies any fever. Patient has been working in her basement since January with a new position at work.  Review of Systems  Constitutional: Negative.   HENT: Positive for congestion, sore throat, rhinorrhea, sneezing and postnasal drip.   Respiratory: Positive for cough.   Cardiovascular: Negative.   Musculoskeletal: Negative.   Skin: Negative.   Neurological: Negative.   Hematological: Negative.   Psychiatric/Behavioral: Negative.    Past Medical History  Diagnosis Date  . Depression   . Hyperlipidemia     History   Social History  . Marital Status: Single    Spouse Name: N/A    Number of Children: N/A  . Years of Education: N/A   Occupational History  . Not on file.   Social History Main Topics  . Smoking status: Never Smoker   . Smokeless tobacco: Never Used  . Alcohol Use: No  . Drug Use: No  . Sexually Active: Not on file   Other Topics Concern  . Not on file   Social History Narrative  . No narrative on file    No past surgical history on file.  Family History  Problem Relation Age of Onset  . Alcohol abuse Mother   . Cirrhosis Mother   . Heart disease Father     Allergies  Allergen Reactions  . Penicillins     REACTION: rash    Current Outpatient Prescriptions on File Prior to Visit  Medication Sig Dispense Refill  . atorvastatin (LIPITOR) 20 MG tablet Take 10 mg by mouth daily.      . clobetasol (TEMOVATE) 0.05 % external solution Apply 1  application topically 2 (two) times daily.  50 mL  0  . fluticasone (FLONASE) 50 MCG/ACT nasal spray USE 1 SPRAY IN THE NOSE DAILY.  16 g  5  . sertraline (ZOLOFT) 50 MG tablet Take 1 tablet (50 mg total) by mouth daily.  90 tablet  3  . spironolactone (ALDACTONE) 25 MG tablet Take 25 mg by mouth daily.        Marland Kitchen levofloxacin (LEVAQUIN) 500 MG tablet Take 1 tablet (500 mg total) by mouth daily.  7 tablet  0    BP 108/58  Pulse 97  Temp 97.5 F (36.4 C) (Axillary)  Ht 5\' 4"  (1.626 m)  Wt 186 lb (84.369 kg)  BMI 31.93 kg/m2  SpO2 98%  LMP 12/31/2013chart    Objective:   Physical Exam  Constitutional: She is oriented to person, place, and time. She appears well-developed and well-nourished.  HENT:  Right Ear: External ear normal.  Left Ear: External ear normal.  Nose: Nose normal.       Nasal turbinates pale and swollen  Neck: Normal range of motion. Neck supple.  Cardiovascular: Normal rate, regular rhythm and normal heart sounds.   Pulmonary/Chest: Effort normal.  Neurological: She is alert and oriented to person, place, and time.  Skin: Skin is warm and dry.  Psychiatric: She has a normal mood and affect.  Assessment & Plan:  Assessment:  1. Allergic rhinitis  2. cough   Plan: I believe that she has an underlying allergen. Possibly mold from being in her basement or something else. We'll consider an allergy referral if her symptoms persist. Prednisone 60x3, 40x3, 20x3. Encouraged over-the-counter Zyrtec 10 mg once daily. Patient call the office if symptoms worsen or persist. Recheck as scheduled, and when necessary.

## 2012-07-01 NOTE — Patient Instructions (Addendum)
Hay Fever  Hay fever is an allergic reaction to particles in the air. It cannot be passed from person to person. It cannot be cured, but it can be controlled.  CAUSES   Hay fever is caused by something that triggers an allergic reaction (allergens). The following are examples of allergens:   Ragweed.   Feathers.   Animal dander.   Grass and tree pollens.   Cigarette smoke.   House dust.   Pollution.  SYMPTOMS    Sneezing.   Runny or stuffy nose.   Tearing eyes.   Itchy eyes, nose, mouth, throat, skin, or other area.   Sore throat.   Headache.   Decreased sense of smell or taste.  DIAGNOSIS  Your caregiver will perform a physical exam and ask questions about the symptoms you are having.Allergy testing may be done to determine exactly what triggers your hay fever.   TREATMENT    Over-the-counter medicines may help symptoms. These include:   Antihistamines.   Decongestants. These may help with nasal congestion.   Your caregiver may prescribe medicines if over-the-counter medicines do not work.   Some people benefit from allergy shots when other medicines are not helpful.  HOME CARE INSTRUCTIONS    Avoid the allergen that is causing your symptoms, if possible.   Take all medicine as told by your caregiver.  SEEK MEDICAL CARE IF:    You have severe allergy symptoms and your current medicines are not helping.   Your treatment was working at one time, but you are now experiencing symptoms.   You have sinus congestion and pressure.   You develop a fever or headache.   You have thick nasal discharge.   You have asthma and have a worsening cough and wheezing.  SEEK IMMEDIATE MEDICAL CARE IF:    You have swelling of your tongue or lips.   You have trouble breathing.   You feel lightheaded or like you are going to faint.   You have cold sweats.   You have a fever.  Document Released: 05/14/2005 Document Revised: 08/06/2011 Document Reviewed: 08/09/2010  [employer] Patient Information 2013  [employer]

## 2012-08-12 ENCOUNTER — Encounter: Payer: Self-pay | Admitting: Family

## 2012-08-12 ENCOUNTER — Ambulatory Visit (INDEPENDENT_AMBULATORY_CARE_PROVIDER_SITE_OTHER): Payer: BC Managed Care – PPO | Admitting: Family

## 2012-08-12 VITALS — BP 100/60 | HR 74 | Temp 98.5°F | Wt 185.0 lb

## 2012-08-12 DIAGNOSIS — J309 Allergic rhinitis, unspecified: Secondary | ICD-10-CM

## 2012-08-12 DIAGNOSIS — J019 Acute sinusitis, unspecified: Secondary | ICD-10-CM

## 2012-08-12 MED ORDER — LEVOFLOXACIN 500 MG PO TABS: 500.0000 mg | ORAL_TABLET | Freq: Every day | ORAL | Status: DC

## 2012-08-12 MED ORDER — METHYLPREDNISOLONE 4 MG PO KIT: PACK | ORAL | Status: DC

## 2012-08-12 NOTE — Progress Notes (Signed)
Subjective:    Patient ID: Andrea Delgado, female    DOB: Sep 02, 1970, 42 y.o.   MRN: [id number]  HPI 42 year old white female, nonsmoker is in today with persistent complaints of sneezing, cough, congestion, increased sinus pressure and pain x1-1/2 weeks and worsening. Takes Zyrtec once daily. Patient has been have been increased allergic flares since she's been working out of her basement that began in December. She's been to the office 3 times. She plans to move out of her basement working. In the past, she's had difficulty responding to antibiotics to completely clear sinus infections.    Review of Systems  Constitutional: Negative.   HENT: Positive for congestion, sore throat, sneezing and postnasal drip.   Respiratory: Positive for cough.   Cardiovascular: Negative.   Musculoskeletal: Negative.   Skin: Negative.   Allergic/Immunologic: Negative.    Past Medical History  Diagnosis Date  . Depression   . Hyperlipidemia     History   Social History  . Marital Status: Single    Spouse Name: N/A    Number of Children: N/A  . Years of Education: N/A   Occupational History  . Not on file.   Social History Main Topics  . Smoking status: Never Smoker   . Smokeless tobacco: Never Used  . Alcohol Use: No  . Drug Use: No  . Sexually Active: Not on file   Other Topics Concern  . Not on file   Social History Narrative  . No narrative on file    History reviewed. No pertinent past surgical history.  Family History  Problem Relation Age of Onset  . Alcohol abuse Mother   . Cirrhosis Mother   . Heart disease Father     Allergies  Allergen Reactions  . Penicillins     REACTION: rash    Current Outpatient Prescriptions on File Prior to Visit  Medication Sig Dispense Refill  . atorvastatin (LIPITOR) 20 MG tablet Take 10 mg by mouth daily.      . clobetasol (TEMOVATE) 0.05 % external solution Apply 1 application topically 2 (two) times daily.  50 mL  0  .  fluticasone (FLONASE) 50 MCG/ACT nasal spray USE 1 SPRAY IN THE NOSE DAILY.  16 g  5  . sertraline (ZOLOFT) 50 MG tablet Take 1 tablet (50 mg total) by mouth daily.  90 tablet  3  . spironolactone (ALDACTONE) 25 MG tablet Take 25 mg by mouth daily.         No current facility-administered medications on file prior to visit.    BP 100/60  Pulse 74  Temp(Src) 98.5 F (36.9 C) (Oral)  Wt 185 lb (83.915 kg)  BMI 31.74 kg/m2  SpO2 98%chart    Objective:   Physical Exam  Constitutional: She is oriented to person, place, and time. She appears well-developed and well-nourished.  HENT:  Right Ear: External ear normal.  Left Ear: External ear normal.  Nose: Nose normal.  Mouth/Throat: Oropharynx is clear and moist.  Neck: Normal range of motion. Neck supple.  Cardiovascular: Normal rate, regular rhythm and normal heart sounds.   Pulmonary/Chest: Effort normal and breath sounds normal.  Neurological: She is alert and oriented to person, place, and time.  Skin: Skin is warm and dry.  Psychiatric: She has a normal mood and affect.          Assessment & Plan:  Assessment: 1. Acute Sinusitis  2. Allergic Rhinitis  Plan: Medrol Dosepak as directed. Levaquin 500 mg one tablet a day  x10 days. Rest. Drink plenty of fluids. Continue Zyrtec once a day. Consider allergy testing. Recheck as needed and to schedule.

## 2012-08-12 NOTE — Patient Instructions (Signed)

## 2012-09-30 ENCOUNTER — Other Ambulatory Visit: Payer: Self-pay | Admitting: Internal Medicine

## 2012-10-28 ENCOUNTER — Telehealth: Payer: Self-pay | Admitting: Internal Medicine

## 2012-10-28 MED ORDER — METHYLPREDNISOLONE 4 MG PO KIT: PACK | ORAL | Status: AC

## 2012-10-28 MED ORDER — LEVOFLOXACIN 500 MG PO TABS: 500.0000 mg | ORAL_TABLET | Freq: Every day | ORAL | Status: DC

## 2012-10-28 NOTE — Telephone Encounter (Signed)
Call regarding: Sinus Infection; says she has been having recurring sinus infections since December; c/o greenish drainage with a slight sore throat; HA, congested; possibly a slight fever; says she keeps taking time off work and paying a copay when she has to do an OV each time; has been seeing Adline Mango on these visits; says the 10 day supply of Levaquin works, but it flares back up at times; says she is taking her Flonase daily and using Mucinex for her sxs, but says she needs the antibiotic to get rid of it; PLEASE call back

## 2012-10-28 NOTE — Telephone Encounter (Signed)
Spoke to the pt.  She continues with reoccurring sinus infection.  Is taking Zyrtec and saline nasal spray OTC.  Will also try a netti pot.  Per Adline Mango, okay to send in Levaquin and Prednisone this one time only.  The pt MUST be seen in the office for further sx.  Pt has been notified of this.  Agreed to come in if problems persist.  Rx sent to [facility] on [address].

## 2012-10-29 ENCOUNTER — Ambulatory Visit: Payer: BC Managed Care – PPO | Admitting: Family

## 2012-11-07 ENCOUNTER — Encounter: Payer: BC Managed Care – PPO | Admitting: Internal Medicine

## 2012-11-25 ENCOUNTER — Telehealth: Payer: Self-pay | Admitting: Internal Medicine

## 2012-11-25 NOTE — Telephone Encounter (Signed)
[facility] and was dx with another sinus inf. [facility]. Pt is requesting prednisone call [facility] inf

## 2012-11-27 MED ORDER — PREDNISONE 20 MG PO TABS: ORAL_TABLET | ORAL | Status: DC

## 2012-11-27 NOTE — Telephone Encounter (Signed)
I will this time but she needs to get down to the root of the problem. Possible allergy testing and/or ENT referral

## 2012-11-27 NOTE — Telephone Encounter (Signed)
Pt aware med called into pharm.pt says thank you!

## 2012-11-27 NOTE — Telephone Encounter (Signed)
Arline Asp,  Would you mind calling this patient back and letting her know that Padonda called in the prednisone? I only ask you because I'd like you to talk to her about what Oran Rein said re allergy testing or ENT referral - in case she has further questions.  She is at work:  [phone]

## 2012-11-27 NOTE — Telephone Encounter (Signed)
Andrea Delgado, - pt called and is also aware of Padonda's recommendations. Will call back for referral if she decides to pursue that route. No need to call her.

## 2012-12-05 ENCOUNTER — Encounter: Payer: Self-pay | Admitting: Family

## 2012-12-05 ENCOUNTER — Ambulatory Visit (INDEPENDENT_AMBULATORY_CARE_PROVIDER_SITE_OTHER): Payer: BC Managed Care – PPO | Admitting: Family

## 2012-12-05 VITALS — BP 102/60 | HR 83 | Wt 187.0 lb

## 2012-12-05 DIAGNOSIS — J309 Allergic rhinitis, unspecified: Secondary | ICD-10-CM

## 2012-12-05 MED ORDER — METHYLPREDNISOLONE 4 MG PO KIT: PACK | ORAL | Status: AC

## 2012-12-05 MED ORDER — LEVOCETIRIZINE DIHYDROCHLORIDE 5 MG PO TABS: 5.0000 mg | ORAL_TABLET | Freq: Every evening | ORAL | Status: DC

## 2012-12-05 NOTE — Patient Instructions (Addendum)
1. Nasonex 2 spray in each nostril once a day and Zyrtec once daily.    Allergic Rhinitis Allergic rhinitis is when the mucous membranes in the nose respond to allergens. Allergens are particles in the air that cause your body to have an allergic reaction. This causes you to release allergic antibodies. Through a chain of events, these eventually cause you to release histamine into the blood stream (hence the use of antihistamines). Although meant to be protective to the body, it is this release that causes your discomfort, such as frequent sneezing, congestion and an itchy runny nose.  CAUSES  The pollen allergens may come from grasses, trees, and weeds. This is seasonal allergic rhinitis, or "hay fever." Other allergens cause year-round allergic rhinitis (perennial allergic rhinitis) such as house dust mite allergen, pet dander and mold spores.  SYMPTOMS   Nasal stuffiness (congestion).  Runny, itchy nose with sneezing and tearing of the eyes.  There is often an itching of the mouth, eyes and ears. It cannot be cured, but it can be controlled with medications. DIAGNOSIS  If you are unable to determine the offending allergen, skin or blood testing may find it. TREATMENT   Avoid the allergen.  Medications and allergy shots (immunotherapy) can help.  Hay fever may often be treated with antihistamines in pill or nasal spray forms. Antihistamines block the effects of histamine. There are over-the-counter medicines that may help with nasal congestion and swelling around the eyes. Check with your caregiver before taking or giving this medicine. If the treatment above does not work, there are many new medications your caregiver can prescribe. Stronger medications may be used if initial measures are ineffective. Desensitizing injections can be used if medications and avoidance fails. Desensitization is when a patient is given ongoing shots until the body becomes less sensitive to the allergen. Make  sure you follow up with your caregiver if problems continue. SEEK MEDICAL CARE IF:   You develop fever (more than 100.5 F (38.1 C).  You develop a cough that does not stop easily (persistent).  You have shortness of breath.  You start wheezing.  Symptoms interfere with normal daily activities. Document Released: 02/06/2001 Document Revised: 08/06/2011 Document Reviewed: 08/18/2008 [facility] Patient Information 2014 [place], [place].

## 2012-12-05 NOTE — Progress Notes (Signed)
Subjective:    Patient ID: Andrea Delgado, female    DOB: July 10, 1970, 42 y.o.   MRN: [id number]  HPI 42 year old white female, nonsmoker is in with recurrent sneezing, coughing, congestion, sore throat and sinus pressure. She's been treated with multiple antibiotics over the last year and steroids. Steroids work very well but temporarily. She says that she's taken Flonase daily and Zyrtec. [facility] and prescribed Levaquin but did not help. Therefore she called here to have a round of prednisone she finished today. Reports as soon as she discontinues the higher doses of prednisone her symptoms resume   Review of Systems  Constitutional: Negative.   HENT: Positive for congestion, sore throat and postnasal drip.   Respiratory: Negative.   Cardiovascular: Negative.   Musculoskeletal: Negative.   Skin: Negative.   Allergic/Immunologic: Positive for environmental allergies. Negative for food allergies and immunocompromised state.  Psychiatric/Behavioral: Negative.    Past Medical History  Diagnosis Date  . Depression   . Hyperlipidemia     History   Social History  . Marital Status: Single    Spouse Name: N/A    Number of Children: N/A  . Years of Education: N/A   Occupational History  . Not on file.   Social History Main Topics  . Smoking status: Never Smoker   . Smokeless tobacco: Never Used  . Alcohol Use: No  . Drug Use: No  . Sexually Active: Not on file   Other Topics Concern  . Not on file   Social History Narrative  . No narrative on file    History reviewed. No pertinent past surgical history.  Family History  Problem Relation Age of Onset  . Alcohol abuse Mother   . Cirrhosis Mother   . Heart disease Father     Allergies  Allergen Reactions  . Penicillins     REACTION: rash    Current Outpatient Prescriptions on File Prior to Visit  Medication Sig Dispense Refill  . atorvastatin (LIPITOR) 20 MG tablet Take 10  mg by mouth daily.      . sertraline (ZOLOFT) 50 MG tablet TAKE 1 TABLET (50 MG TOTAL) BY MOUTH DAILY  90 tablet  0  . spironolactone (ALDACTONE) 25 MG tablet Take 25 mg by mouth daily.        Marland Kitchen levofloxacin (LEVAQUIN) 500 MG tablet Take 1 tablet (500 mg total) by mouth daily.  10 tablet  0  . predniSONE (DELTASONE) 20 MG tablet 60mg  PO qam x 3 days, 40mg  po qam x 3 days, 20mg  qam x 3 days  18 tablet  0   No current facility-administered medications on file prior to visit.    BP 102/60  Pulse 83  Wt 187 lb (84.823 kg)  BMI 32.08 kg/m2  SpO2 98%chart    Objective:   Physical Exam  Constitutional: She is oriented to person, place, and time. She appears well-developed and well-nourished.  HENT:  Right Ear: External ear normal.  Left Ear: External ear normal.  Nose: Nose normal.  Mouth/Throat: Oropharynx is clear and moist.  Neck: Normal range of motion. Neck supple.  Cardiovascular: Normal rate, regular rhythm and normal heart sounds.   Pulmonary/Chest: Effort normal and breath sounds normal.  Neurological: She is alert and oriented to person, place, and time.  Skin: Skin is warm and dry.  Psychiatric: She has a normal mood and affect.          Assessment & Plan:  Assessment: 1. Allergic rhinitis-recurrent  2. Depression  plan:  the Medrol Pak over the weekend if she needed. However I have advised her not to fill it and is absolutely necessary. I do not believe she needs any additional antibiotics. Nasonex 2 sprays in each nostril once a day.Xyzal 5 mg once a day. Will recheck with patient on Monday via my chart to see a she's doing. Consider referral to ENT pending her allergy testing today.

## 2012-12-08 ENCOUNTER — Encounter: Payer: Self-pay | Admitting: Family

## 2012-12-08 LAB — ~~LOC~~ ALLERGY PANEL
Allergen, Cedar tree, t12: <0.1 kU/L
Allergen, Comm Silver Birch, t9: <0.1 kU/L
Allergen, D pternoyssinus,d7: 0.19 kU/L — ABNORMAL HIGH
Allergen, Mulberry, t76: <0.1 kU/L
Alternaria Alternata: <0.1 kU/L
Aspergillus fumigatus, m3: <0.1 kU/L
Bahia Grass: <0.1 kU/L
Bermuda Grass: <0.1 kU/L
Box Elder IgE: <0.1 kU/L
Cat Dander: <0.1 kU/L
Cladosporium Herbarum: <0.1 kU/L
Cockroach: <0.1 kU/L
Common Ragweed: <0.1 kU/L
D. farinae: 0.35 kU/L — ABNORMAL HIGH
Dog Dander: <0.1 kU/L
Elm IgE: <0.1 kU/L
Johnson Grass: <0.1 kU/L
Mucor Racemosus: <0.1 kU/L
Mugwort: <0.1 kU/L
Nettle: <0.1 kU/L
Oak: <0.1 kU/L
Pecan/Hickory Tree IgE: <0.1 kU/L
Penicillium Notatum: <0.1 kU/L
Plantain: <0.1 kU/L
Rough Pigweed  IgE: <0.1 kU/L
Sheep Sorrel IgE: <0.1 kU/L
Stemphylium Botryosum: <0.1 kU/L
Sweet Gum: <0.1 kU/L
Timothy Grass: <0.1 kU/L

## 2012-12-16 ENCOUNTER — Encounter: Payer: Self-pay | Admitting: Family

## 2012-12-16 ENCOUNTER — Telehealth: Payer: Self-pay

## 2012-12-16 NOTE — Telephone Encounter (Signed)
Message copied by Beverely Low on Tue Dec 16, 2012 11:13 AM ------      Message from: Adline Mango B      Created: Mon Dec 15, 2012 12:46 PM       Refer to allergist for additional testing. ------

## 2012-12-16 NOTE — Telephone Encounter (Signed)
Responded to pt via MyChart

## 2012-12-17 ENCOUNTER — Encounter: Payer: Self-pay | Admitting: Family

## 2012-12-17 ENCOUNTER — Other Ambulatory Visit: Payer: Self-pay

## 2012-12-17 MED ORDER — FLUTICASONE PROPIONATE 50 MCG/ACT NA SUSP: 2.0000 | Freq: Every day | NASAL | Status: DC

## 2012-12-17 MED ORDER — MOMETASONE FUROATE 50 MCG/ACT NA SUSP: 2.0000 | Freq: Every day | NASAL | Status: DC

## 2012-12-17 NOTE — Telephone Encounter (Signed)
Nasonex samples given by Padonda. Nasonex sent to pharmacy

## 2012-12-17 NOTE — Progress Notes (Signed)
Error

## 2012-12-24 ENCOUNTER — Telehealth: Payer: Self-pay | Admitting: Internal Medicine

## 2012-12-24 NOTE — Telephone Encounter (Signed)
Ok with me 

## 2012-12-24 NOTE — Telephone Encounter (Signed)
yes

## 2012-12-24 NOTE — Telephone Encounter (Signed)
PT is requesting to transfer pcp from Dr. Cato Mulligan to Adline Mango. May I schedule this?

## 2012-12-25 NOTE — Telephone Encounter (Signed)
lmovm x2 / ga

## 2013-01-05 ENCOUNTER — Encounter: Payer: Self-pay | Admitting: Family

## 2013-01-05 ENCOUNTER — Ambulatory Visit (INDEPENDENT_AMBULATORY_CARE_PROVIDER_SITE_OTHER): Payer: BC Managed Care – PPO | Admitting: Family

## 2013-01-05 VITALS — BP 104/60 | HR 89 | Ht 63.25 in | Wt 193.0 lb

## 2013-01-05 DIAGNOSIS — J309 Allergic rhinitis, unspecified: Secondary | ICD-10-CM

## 2013-01-05 DIAGNOSIS — F329 Major depressive disorder, single episode, unspecified: Secondary | ICD-10-CM

## 2013-01-05 DIAGNOSIS — F32A Depression, unspecified: Secondary | ICD-10-CM

## 2013-01-05 DIAGNOSIS — Z1231 Encounter for screening mammogram for malignant neoplasm of breast: Secondary | ICD-10-CM

## 2013-01-05 DIAGNOSIS — F3289 Other specified depressive episodes: Secondary | ICD-10-CM

## 2013-01-05 DIAGNOSIS — Z0000 Encounter for general adult medical examination without abnormal findings: Secondary | ICD-10-CM

## 2013-01-05 DIAGNOSIS — E7800 Pure hypercholesterolemia, unspecified: Secondary | ICD-10-CM

## 2013-01-05 LAB — TSH: TSH: 3.19 u[IU]/mL (ref 0.35–5.50)

## 2013-01-05 LAB — CBC WITH DIFFERENTIAL/PLATELET
Basophils Absolute: 0 10*3/uL (ref 0.0–0.1)
Basophils Relative: 0.2 % (ref 0.0–3.0)
Eosinophils Absolute: 0.2 10*3/uL (ref 0.0–0.7)
Eosinophils Relative: 3.9 % (ref 0.0–5.0)
HCT: 42.1 % (ref 36.0–46.0)
Hemoglobin: 14 g/dL (ref 12.0–15.0)
Lymphocytes Relative: 17.2 % (ref 12.0–46.0)
Lymphs Abs: 0.9 10*3/uL (ref 0.7–4.0)
MCHC: 33.2 g/dL (ref 30.0–36.0)
MCV: 93.4 fl (ref 78.0–100.0)
Monocytes Absolute: 0.4 10*3/uL (ref 0.1–1.0)
Monocytes Relative: 6.5 % (ref 3.0–12.0)
Neutro Abs: 3.9 10*3/uL (ref 1.4–7.7)
Neutrophils Relative %: 72.2 % (ref 43.0–77.0)
Platelets: 340 10*3/uL (ref 150.0–400.0)
RBC: 4.51 Mil/uL (ref 3.87–5.11)
RDW: 11.9 % (ref 11.5–14.6)
WBC: 5.4 10*3/uL (ref 4.5–10.5)

## 2013-01-05 LAB — POCT URINALYSIS DIPSTICK
Bilirubin, UA: NEGATIVE
Blood, UA: NEGATIVE
Glucose, UA: NEGATIVE
Ketones, UA: NEGATIVE
Leukocytes, UA: NEGATIVE
Nitrite, UA: NEGATIVE
Protein, UA: NEGATIVE
Spec Grav, UA: <=1.005
Urobilinogen, UA: 0.2
pH, UA: 6

## 2013-01-05 LAB — LDL CHOLESTEROL, DIRECT: Direct LDL: 140.1 mg/dL

## 2013-01-05 LAB — BASIC METABOLIC PANEL
BUN: 11 mg/dL (ref 6–23)
CO2: 23 mEq/L (ref 19–32)
Calcium: 9.3 mg/dL (ref 8.4–10.5)
Chloride: 107 mEq/L (ref 96–112)
Creatinine, Ser: 0.7 mg/dL (ref 0.4–1.2)
GFR: 97.2 mL/min (ref >60.00)
Glucose, Bld: 100 mg/dL — ABNORMAL HIGH (ref 70–99)
Potassium: 4 mEq/L (ref 3.5–5.1)
Sodium: 136 mEq/L (ref 135–145)

## 2013-01-05 LAB — LIPID PANEL
Cholesterol: 224 mg/dL — ABNORMAL HIGH (ref 0–200)
HDL: 75.4 mg/dL (ref >39.00)
Total CHOL/HDL Ratio: 3
Triglycerides: 92 mg/dL (ref 0.0–149.0)
VLDL: 18.4 mg/dL (ref 0.0–40.0)

## 2013-01-05 MED ORDER — SERTRALINE HCL 100 MG PO TABS: ORAL_TABLET | ORAL | Status: DC

## 2013-01-05 NOTE — Patient Instructions (Addendum)

## 2013-01-05 NOTE — Progress Notes (Signed)
Subjective:    Patient ID: Andrea Delgado, female    DOB: 04/28/1971, 42 y.o.   MRN: [id number]  HPI 42 year old white female, nonsmoker, patient of Dr. Cato Mulligan is in today for complete physical exam. Denies any concerns today.  Reviewed all health maintenance protocols including mammography  and reviewed appropriate screening labs. Her immunization history was reviewed as well as her current medications and allergies refills of her chronic medications were given and the plan for yearly health maintenance was discussed all orders and referrals were made as appropriate.   Review of Systems  Constitutional: Negative.   HENT: Negative.   Eyes: Negative.   Respiratory: Negative.   Cardiovascular: Negative.   Gastrointestinal: Negative.   Endocrine: Negative.   Genitourinary: Negative.   Musculoskeletal: Negative.   Skin: Negative.   Allergic/Immunologic: Negative.   Neurological: Negative.   Hematological: Negative.   Psychiatric/Behavioral: Negative.    Past Medical History  Diagnosis Date  . Depression   . Hyperlipidemia     History   Social History  . Marital Status: Single    Spouse Name: N/A    Number of Children: N/A  . Years of Education: N/A   Occupational History  . Not on file.   Social History Main Topics  . Smoking status: Never Smoker   . Smokeless tobacco: Never Used  . Alcohol Use: No  . Drug Use: No  . Sexually Active: Not on file   Other Topics Concern  . Not on file   Social History Narrative  . No narrative on file    History reviewed. No pertinent past surgical history.  Family History  Problem Relation Age of Onset  . Alcohol abuse Mother   . Cirrhosis Mother   . Heart disease Father     Allergies  Allergen Reactions  . Penicillins     REACTION: rash    Current Outpatient Prescriptions on File Prior to Visit  Medication Sig Dispense Refill  . atorvastatin (LIPITOR) 20 MG tablet Take 10 mg by mouth daily.      Marland Kitchen levocetirizine  (XYZAL) 5 MG tablet Take 1 tablet (5 mg total) by mouth every evening.  30 tablet  1  . mometasone (NASONEX) 50 MCG/ACT nasal spray Place 2 sprays into the nose daily.  17 g  4  . spironolactone (ALDACTONE) 25 MG tablet Take 25 mg by mouth daily.        . predniSONE (DELTASONE) 20 MG tablet 60mg  PO qam x 3 days, 40mg  po qam x 3 days, 20mg  qam x 3 days  18 tablet  0   No current facility-administered medications on file prior to visit.    BP 104/60  Pulse 89  Ht 5' 3.25" (1.607 m)  Wt 193 lb (87.544 kg)  BMI 33.9 kg/m2chart    Objective:   Physical Exam  Constitutional: She is oriented to person, place, and time. She appears well-developed and well-nourished.  HENT:  Head: Normocephalic.  Right Ear: External ear normal.  Left Ear: External ear normal.  Nose: Nose normal.  Mouth/Throat: Oropharynx is clear and moist.  Eyes: Conjunctivae and EOM are normal. Pupils are equal, round, and reactive to light.  Neck: Normal range of motion. Neck supple. No thyromegaly present.  Cardiovascular: Normal rate, regular rhythm and normal heart sounds.   Pulmonary/Chest: Effort normal and breath sounds normal.  Abdominal: Soft. Bowel sounds are normal.  Musculoskeletal: Normal range of motion.  Neurological: She is alert and oriented to person, place, and time. She  has normal reflexes. She displays normal reflexes. No cranial nerve deficit. Coordination normal.  Skin: Skin is warm and dry.  Psychiatric: She has a normal mood and affect.          Assessment & Plan:  Assessment: 1. CPX 2. Hypercholesterolemia 3. Depression 4. Allergic Rhinitis   Plan: Labs sent. Will discuss pending results. Continue current medications. Patient the office with any questions or concerns. Recheck in 6 months and sooner as needed. Encouraged healthy diet and exercise.

## 2013-01-06 ENCOUNTER — Encounter: Payer: Self-pay | Admitting: Family

## 2013-01-07 ENCOUNTER — Other Ambulatory Visit: Payer: Self-pay

## 2013-01-07 MED ORDER — SERTRALINE HCL 50 MG PO TABS: 50.0000 mg | ORAL_TABLET | Freq: Every day | ORAL | Status: DC

## 2013-01-09 ENCOUNTER — Telehealth: Payer: Self-pay | Admitting: Family

## 2013-01-09 ENCOUNTER — Encounter: Payer: Self-pay | Admitting: Family

## 2013-01-09 ENCOUNTER — Other Ambulatory Visit: Payer: Self-pay

## 2013-01-09 MED ORDER — SERTRALINE HCL 100 MG PO TABS: 100.0000 mg | ORAL_TABLET | Freq: Every day | ORAL | Status: DC

## 2013-01-09 NOTE — Telephone Encounter (Signed)
Correct Rx was sent into [facility]. Pt aware via MyChart and spoke with rep from pharmacy to clarify

## 2013-01-09 NOTE — Telephone Encounter (Signed)
PT called and stated that [facility] needed to hear from her provider prior to filling her sertraline (ZOLOFT) 50 MG tablet. Please called them at [phone]. Please assist.

## 2013-01-22 ENCOUNTER — Ambulatory Visit
Admission: RE | Admit: 2013-01-22 | Discharge: 2013-01-22 | Disposition: A | Discharge status: Home / Self Care | Payer: BC Managed Care – PPO | Source: Ambulatory Visit | Attending: Family | Admitting: Family

## 2013-01-22 DIAGNOSIS — Z1231 Encounter for screening mammogram for malignant neoplasm of breast: Secondary | ICD-10-CM

## 2013-01-23 ENCOUNTER — Encounter: Payer: BC Managed Care – PPO | Admitting: Internal Medicine

## 2013-01-28 ENCOUNTER — Encounter: Payer: Self-pay | Admitting: Family

## 2013-01-28 ENCOUNTER — Other Ambulatory Visit: Payer: Self-pay | Admitting: Family

## 2013-01-28 DIAGNOSIS — N6310 Unspecified lump in the right breast, unspecified quadrant: Secondary | ICD-10-CM

## 2013-01-28 MED ORDER — LEVOCETIRIZINE DIHYDROCHLORIDE 5 MG PO TABS: 5.0000 mg | ORAL_TABLET | Freq: Every evening | ORAL | Status: DC

## 2013-01-29 ENCOUNTER — Other Ambulatory Visit: Payer: Self-pay | Admitting: Family

## 2013-01-30 ENCOUNTER — Other Ambulatory Visit: Payer: Self-pay | Admitting: Family

## 2013-01-30 MED ORDER — LEVOCETIRIZINE DIHYDROCHLORIDE 5 MG PO TABS: 5.0000 mg | ORAL_TABLET | Freq: Every evening | ORAL | Status: DC

## 2013-02-05 ENCOUNTER — Other Ambulatory Visit: Payer: Self-pay

## 2013-02-05 MED ORDER — ATORVASTATIN CALCIUM 20 MG PO TABS: 20.0000 mg | ORAL_TABLET | Freq: Every day | ORAL | Status: DC

## 2013-02-10 ENCOUNTER — Ambulatory Visit
Admission: RE | Admit: 2013-02-10 | Discharge: 2013-02-10 | Disposition: A | Discharge status: Home / Self Care | Payer: BC Managed Care – PPO | Source: Ambulatory Visit | Attending: Family | Admitting: Family

## 2013-02-10 DIAGNOSIS — N6310 Unspecified lump in the right breast, unspecified quadrant: Secondary | ICD-10-CM

## 2013-02-11 ENCOUNTER — Other Ambulatory Visit: Payer: Self-pay

## 2013-02-11 MED ORDER — ATORVASTATIN CALCIUM 20 MG PO TABS: 20.0000 mg | ORAL_TABLET | Freq: Every day | ORAL | Status: DC

## 2013-04-02 ENCOUNTER — Other Ambulatory Visit: Payer: Self-pay

## 2013-05-26 ENCOUNTER — Encounter: Payer: Self-pay | Admitting: Family

## 2013-05-26 ENCOUNTER — Ambulatory Visit (INDEPENDENT_AMBULATORY_CARE_PROVIDER_SITE_OTHER): Payer: BC Managed Care – PPO | Admitting: Family

## 2013-05-26 VITALS — BP 108/60 | HR 82 | Temp 97.4°F | Wt 192.0 lb

## 2013-05-26 DIAGNOSIS — J019 Acute sinusitis, unspecified: Secondary | ICD-10-CM

## 2013-05-26 DIAGNOSIS — J309 Allergic rhinitis, unspecified: Secondary | ICD-10-CM

## 2013-05-26 MED ORDER — LEVOFLOXACIN 500 MG PO TABS: 500.0000 mg | ORAL_TABLET | Freq: Every day | ORAL | Status: DC

## 2013-05-26 NOTE — Progress Notes (Signed)
Subjective:    Patient ID: Andrea Delgado, female    DOB: 1970-11-07, 42 y.o.   MRN: [id number]  HPI 42 year old white female, nonsmoker, with a history of sinusitis in complaint of sneezing, cough, congestion, sinus pressure and pain x3 days and worsening. She typically uses Nasonex daily and an antihistamine. She has also moved out of her basement for which she was working and had mold. She feels like these changes in these medications have helped significantly with reducing the frequency of sinus infections. However, she is here for flare today. She had her prescription for prednisone that she started taking 2 days ago.   Review of Systems  Constitutional: Negative.   HENT: Positive for congestion, sinus pressure and sore throat.   Respiratory: Positive for cough.   Cardiovascular: Negative.   Gastrointestinal: Negative.   Musculoskeletal: Negative.   Skin: Negative.   Allergic/Immunologic: Negative.   Neurological: Negative.   Psychiatric/Behavioral: Negative.    Past Medical History  Diagnosis Date  . Depression   . Hyperlipidemia     History   Social History  . Marital Status: Single    Spouse Name: N/A    Number of Children: N/A  . Years of Education: N/A   Occupational History  . Not on file.   Social History Main Topics  . Smoking status: Never Smoker   . Smokeless tobacco: Never Used  . Alcohol Use: No  . Drug Use: No  . Sexual Activity: Not on file   Other Topics Concern  . Not on file   Social History Narrative  . No narrative on file    History reviewed. No pertinent past surgical history.  Family History  Problem Relation Age of Onset  . Alcohol abuse Mother   . Cirrhosis Mother   . Heart disease Father     Allergies  Allergen Reactions  . Penicillins     REACTION: rash    Current Outpatient Prescriptions on File Prior to Visit  Medication Sig Dispense Refill  . atorvastatin (LIPITOR) 20 MG tablet Take 1 tablet (20 mg total) by  mouth daily.  90 tablet  0  . levocetirizine (XYZAL) 5 MG tablet TAKE 1 TABLET (5 MG TOTAL) BY MOUTH EVERY EVENING.  30 tablet  1  . levocetirizine (XYZAL) 5 MG tablet Take 1 tablet (5 mg total) by mouth every evening.  30 tablet  5  . mometasone (NASONEX) 50 MCG/ACT nasal spray Place 2 sprays into the nose daily.  17 g  4  . predniSONE (DELTASONE) 20 MG tablet 60mg  PO qam x 3 days, 40mg  po qam x 3 days, 20mg  qam x 3 days  18 tablet  0  . sertraline (ZOLOFT) 100 MG tablet Take 1 tablet (100 mg total) by mouth daily.  90 tablet  1  . spironolactone (ALDACTONE) 25 MG tablet Take 25 mg by mouth daily.         No current facility-administered medications on file prior to visit.    BP 108/60  Pulse 82  Temp(Src) 97.4 F (36.3 C) (Oral)  Wt 192 lb (87.091 kg)chart    Objective:   Physical Exam  Constitutional: She is oriented to person, place, and time. She appears well-developed and well-nourished.  HENT:  Right Ear: External ear normal.  Left Ear: External ear normal.  Nose: Nose normal.  Mouth/Throat: Oropharynx is clear and moist.  Tenderness to palpation of the maxillary sinuses bilaterally  Neck: Normal range of motion. Neck supple.  Cardiovascular: Normal rate,  regular rhythm and normal heart sounds.   Pulmonary/Chest: Effort normal and breath sounds normal.  Neurological: She is alert and oriented to person, place, and time.  Skin: Skin is warm and dry.  Psychiatric: She has a normal mood and affect.          Assessment & Plan:  Assessment: 1. Acute sinusitis  2. Allergic rhinitis  Plan: Levaquin 500 mg one tablet daily x7 days. Continue prednisone. Call the office symptoms worsen or persist. Recheck as scheduled, and as needed.

## 2013-05-26 NOTE — Progress Notes (Signed)
Pre visit review using our clinic review tool, if applicable. No additional management support is needed unless otherwise documented below in the visit note. 

## 2013-05-26 NOTE — Patient Instructions (Signed)

## 2013-05-27 ENCOUNTER — Other Ambulatory Visit: Payer: Self-pay | Admitting: Family

## 2013-06-30 ENCOUNTER — Encounter: Payer: Self-pay | Admitting: Family

## 2013-06-30 MED ORDER — METHYLPREDNISOLONE 4 MG PO KIT: PACK | ORAL | Status: DC

## 2013-06-30 MED ORDER — METHYLPREDNISOLONE 4 MG PO KIT: PACK | ORAL | Status: AC

## 2013-06-30 NOTE — Addendum Note (Signed)
Addended byAdline Mango: CAMPBELL, PADONDA B on: 06/30/2013 11:19 AM   Modules accepted: Orders

## 2013-07-28 ENCOUNTER — Encounter: Payer: Self-pay | Admitting: Family

## 2013-07-28 ENCOUNTER — Other Ambulatory Visit: Payer: Self-pay | Admitting: Family

## 2013-07-28 DIAGNOSIS — J329 Chronic sinusitis, unspecified: Secondary | ICD-10-CM

## 2013-07-28 NOTE — Telephone Encounter (Signed)
Pt called to fu on message she sent to you. Would like referral to ent to see why she continues to have these issues. Pt states she doesn't think she has sinus inf now, just inflamation, sinus headaches and she is trying to prevent one. Pt would like the rx on prednisone b/c that helps the most. [facility] [address]

## 2013-07-30 ENCOUNTER — Encounter: Payer: Self-pay | Admitting: Family

## 2013-08-06 ENCOUNTER — Encounter: Payer: Self-pay | Admitting: Family

## 2013-08-06 MED ORDER — LEVOCETIRIZINE DIHYDROCHLORIDE 5 MG PO TABS: ORAL_TABLET | ORAL | Status: DC

## 2013-08-06 MED ORDER — SERTRALINE HCL 100 MG PO TABS: 100.0000 mg | ORAL_TABLET | Freq: Every day | ORAL | Status: DC

## 2013-08-06 MED ORDER — ATORVASTATIN CALCIUM 20 MG PO TABS: ORAL_TABLET | ORAL | Status: DC

## 2013-08-06 MED ORDER — MOMETASONE FUROATE 50 MCG/ACT NA SUSP: 2.0000 | Freq: Every day | NASAL | Status: DC

## 2013-08-24 ENCOUNTER — Other Ambulatory Visit: Payer: Self-pay | Admitting: Family

## 2013-08-24 MED ORDER — MOMETASONE FUROATE 50 MCG/ACT NA SUSP: 2.0000 | Freq: Every day | NASAL | Status: DC

## 2013-12-03 ENCOUNTER — Other Ambulatory Visit: Payer: Self-pay

## 2013-12-03 DIAGNOSIS — Z1231 Encounter for screening mammogram for malignant neoplasm of breast: Secondary | ICD-10-CM

## 2014-01-22 ENCOUNTER — Other Ambulatory Visit: Payer: Self-pay | Admitting: Family

## 2014-02-03 ENCOUNTER — Ambulatory Visit
Admission: RE | Admit: 2014-02-03 | Discharge: 2014-02-03 | Disposition: A | Discharge status: Home / Self Care | Payer: BC Managed Care – PPO | Source: Ambulatory Visit

## 2014-02-03 DIAGNOSIS — Z1231 Encounter for screening mammogram for malignant neoplasm of breast: Secondary | ICD-10-CM

## 2014-02-21 ENCOUNTER — Other Ambulatory Visit: Payer: Self-pay | Admitting: Family

## 2014-02-25 ENCOUNTER — Ambulatory Visit (INDEPENDENT_AMBULATORY_CARE_PROVIDER_SITE_OTHER): Payer: BC Managed Care – PPO | Admitting: Family

## 2014-02-25 ENCOUNTER — Encounter: Payer: Self-pay | Admitting: Family

## 2014-02-25 VITALS — BP 108/62 | HR 74 | Ht 63.0 in | Wt 198.0 lb

## 2014-02-25 DIAGNOSIS — E7800 Pure hypercholesterolemia, unspecified: Secondary | ICD-10-CM

## 2014-02-25 DIAGNOSIS — I10 Essential (primary) hypertension: Secondary | ICD-10-CM

## 2014-02-25 DIAGNOSIS — J309 Allergic rhinitis, unspecified: Secondary | ICD-10-CM

## 2014-02-25 DIAGNOSIS — F329 Major depressive disorder, single episode, unspecified: Secondary | ICD-10-CM

## 2014-02-25 DIAGNOSIS — Z0000 Encounter for general adult medical examination without abnormal findings: Secondary | ICD-10-CM

## 2014-02-25 DIAGNOSIS — F32A Depression, unspecified: Secondary | ICD-10-CM

## 2014-02-25 LAB — POCT URINALYSIS DIPSTICK
Bilirubin, UA: NEGATIVE
Glucose, UA: NEGATIVE
Ketones, UA: NEGATIVE
Leukocytes, UA: NEGATIVE
Nitrite, UA: NEGATIVE
Protein, UA: NEGATIVE
Spec Grav, UA: <=1.005
Urobilinogen, UA: 0.2
pH, UA: 5.5

## 2014-02-25 LAB — COMPREHENSIVE METABOLIC PANEL
ALT: 12 U/L (ref 0–35)
AST: 21 U/L (ref 0–37)
Albumin: 4.2 g/dL (ref 3.5–5.2)
Alkaline Phosphatase: 65 U/L (ref 39–117)
BUN: 15 mg/dL (ref 6–23)
CO2: 22 mEq/L (ref 19–32)
Calcium: 9.2 mg/dL (ref 8.4–10.5)
Chloride: 106 mEq/L (ref 96–112)
Creatinine, Ser: 0.8 mg/dL (ref 0.4–1.2)
GFR: 89.28 mL/min (ref >60.00)
Glucose, Bld: 83 mg/dL (ref 70–99)
Potassium: 3.9 mEq/L (ref 3.5–5.1)
Sodium: 136 mEq/L (ref 135–145)
Total Bilirubin: 0.2 mg/dL (ref 0.2–1.2)
Total Protein: 7.6 g/dL (ref 6.0–8.3)

## 2014-02-25 LAB — TSH: TSH: 2.01 u[IU]/mL (ref 0.35–4.50)

## 2014-02-25 LAB — CBC WITH DIFFERENTIAL/PLATELET
Basophils Absolute: 0 10*3/uL (ref 0.0–0.1)
Basophils Relative: 0.1 % (ref 0.0–3.0)
Eosinophils Absolute: 0.3 10*3/uL (ref 0.0–0.7)
Eosinophils Relative: 4.8 % (ref 0.0–5.0)
HCT: 38.7 % (ref 36.0–46.0)
Hemoglobin: 13.2 g/dL (ref 12.0–15.0)
Lymphocytes Relative: 17.1 % (ref 12.0–46.0)
Lymphs Abs: 1 10*3/uL (ref 0.7–4.0)
MCHC: 34.2 g/dL (ref 30.0–36.0)
MCV: 89.7 fl (ref 78.0–100.0)
Monocytes Absolute: 0.4 10*3/uL (ref 0.1–1.0)
Monocytes Relative: 6.7 % (ref 3.0–12.0)
Neutro Abs: 4.3 10*3/uL (ref 1.4–7.7)
Neutrophils Relative %: 71.3 % (ref 43.0–77.0)
Platelets: 328 10*3/uL (ref 150.0–400.0)
RBC: 4.32 Mil/uL (ref 3.87–5.11)
RDW: 12.5 % (ref 11.5–15.5)
WBC: 6.1 10*3/uL (ref 4.0–10.5)

## 2014-02-25 LAB — LIPID PANEL
Cholesterol: 206 mg/dL — ABNORMAL HIGH (ref 0–200)
HDL: 71.4 mg/dL (ref >39.00)
LDL Cholesterol: 111 mg/dL — ABNORMAL HIGH (ref 0–99)
NonHDL: 134.6
Total CHOL/HDL Ratio: 3
Triglycerides: 118 mg/dL (ref 0.0–149.0)
VLDL: 23.6 mg/dL (ref 0.0–40.0)

## 2014-02-25 NOTE — Progress Notes (Signed)
Pre visit review using our clinic review tool, if applicable. No additional management support is needed unless otherwise documented below in the visit note. 

## 2014-02-25 NOTE — Patient Instructions (Signed)
Cardiac Diet This diet can help prevent heart disease and stroke. Many factors influence your heart health, including eating and exercise habits. Coronary risk rises a lot with abnormal blood fat (lipid) levels. Cardiac meal planning includes limiting unhealthy fats, increasing healthy fats, and making other small dietary changes. General guidelines are as follows:  Adjust calorie intake to reach and maintain desirable body weight.  Limit total fat intake to less than 30% of total calories. Saturated fat should be less than 7% of calories.  Saturated fats are found in animal products and in some vegetable products. Saturated vegetable fats are found in coconut oil, cocoa butter, palm oil, and palm kernel oil. Read labels carefully to avoid these products as much as possible. Use butter in moderation. Choose tub margarines and oils that have 2 grams of fat or less. Good cooking oils are canola and olive oils.  Practice low-fat cooking techniques. Do not fry food. Instead, broil, bake, boil, steam, grill, roast on a rack, stir-fry, or microwave it. Other fat reducing suggestions include:  Remove the skin from poultry.  Remove all visible fat from meats.  Skim the fat off stews, soups, and gravies before serving them.  Steam vegetables in water or broth instead of sauting them in fat.  Avoid foods with trans fat (or hydrogenated oils), such as commercially fried foods and commercially baked goods. Commercial shortening and deep-frying fats will contain trans fat.  Increase intake of fruits, vegetables, whole grains, and legumes to replace foods high in fat.  Increase consumption of nuts, legumes, and seeds to at least 4 servings weekly. One serving of a legume equals  cup, and 1 serving of nuts or seeds equals  cup.  Choose whole grains more often. Have 3 servings per day (a serving is 1 ounce [oz]).  Eat 4 to 5 servings of vegetables per day. A serving of vegetables is 1 cup of raw leafy  vegetables;  cup of raw or cooked cut-up vegetables;  cup of vegetable juice.  Eat 4 to 5 servings of fruit per day. A serving of fruit is 1 medium whole fruit;  cup of dried fruit;  cup of fresh, frozen, or canned fruit;  cup of 100% fruit juice.  Increase your intake of dietary fiber to 20 to 30 grams per day. Insoluble fiber may help lower your risk of heart disease and may help curb your appetite.  Soluble fiber binds cholesterol to be removed from the blood. Foods high in soluble fiber are dried beans, citrus fruits, oats, apples, bananas, broccoli, Brussels sprouts, and eggplant.  Try to include foods fortified with plant sterols or stanols, such as yogurt, breads, juices, or margarines. Choose several fortified foods to achieve a daily intake of 2 to 3 grams of plant sterols or stanols.  Foods with omega-3 fats can help reduce your risk of heart disease. Aim to have a 3.5 oz portion of fatty fish twice per week, such as salmon, mackerel, albacore tuna, sardines, lake trout, or herring. If you wish to take a fish oil supplement, choose one that contains 1 gram of both DHA and EPA.  Limit processed meats to 2 servings (3 oz portion) weekly.  Limit the sodium in your diet to 1500 milligrams (mg) per day. If you have high blood pressure, talk to a registered dietitian about a DASH (Dietary Approaches to Stop Hypertension) eating plan.  Limit sweets and beverages with added sugar, such as soda, to no more than 5 servings per week. One   serving is:   1 tablespoon sugar.  1 tablespoon jelly or jam.   cup sorbet.  1 cup lemonade.   cup regular soda. CHOOSING FOODS Starches  Allowed: Breads: All kinds (wheat, rye, raisin, white, oatmeal, Italian, French, and English muffin bread). Low-fat rolls: English muffins, frankfurter and hamburger buns, bagels, pita bread, tortillas (not fried). Pancakes, waffles, biscuits, and muffins made with recommended oil.  Avoid: Products made with  saturated or trans fats, oils, or whole milk products. Butter rolls, cheese breads, croissants. Commercial doughnuts, muffins, sweet rolls, biscuits, waffles, pancakes, store-bought mixes. Crackers  Allowed: Low-fat crackers and snacks: Animal, graham, rye, saltine (with recommended oil, no lard), oyster, and matzo crackers. Bread sticks, melba toast, rusks, flatbread, pretzels, and light popcorn.  Avoid: High-fat crackers: cheese crackers, butter crackers, and those made with coconut, palm oil, or trans fat (hydrogenated oils). Buttered popcorn. Cereals  Allowed: Hot or cold whole-grain cereals.  Avoid: Cereals containing coconut, hydrogenated vegetable fat, or animal fat. Potatoes / Pasta / Rice  Allowed: All kinds of potatoes, rice, and pasta (such as macaroni, spaghetti, and noodles).  Avoid: Pasta or rice prepared with cream sauce or high-fat cheese. Chow mein noodles, French fries. Vegetables  Allowed: All vegetables and vegetable juices.  Avoid: Fried vegetables. Vegetables in cream, butter, or high-fat cheese sauces. Limit coconut. Fruit in cream or custard. Protein  Allowed: Limit your intake of meat, seafood, and poultry to no more than 6 oz (cooked weight) per day. All lean, well-trimmed beef, veal, pork, and lamb. All chicken and turkey without skin. All fish and shellfish. Wild game: wild duck, rabbit, pheasant, and venison. Egg whites or low-cholesterol egg substitutes may be used as desired. Meatless dishes: recipes with dried beans, peas, lentils, and tofu (soybean curd). Seeds and nuts: all seeds and most nuts.  Avoid: Prime grade and other heavily marbled and fatty meats, such as short ribs, spare ribs, rib eye roast or steak, frankfurters, sausage, bacon, and high-fat luncheon meats, mutton. Caviar. Commercially fried fish. Domestic duck, goose, venison sausage. Organ meats: liver, gizzard, heart, chitterlings, brains, kidney, sweetbreads. Dairy  Allowed: Low-fat  cheeses: nonfat or low-fat cottage cheese (1% or 2% fat), cheeses made with part skim milk, such as mozzarella, farmers, string, or ricotta. (Cheeses should be labeled no more than 2 to 6 grams fat per oz.). Skim (or 1%) milk: liquid, powdered, or evaporated. Buttermilk made with low-fat milk. Drinks made with skim or low-fat milk or cocoa. Chocolate milk or cocoa made with skim or low-fat (1%) milk. Nonfat or low-fat yogurt.  Avoid: Whole milk cheeses, including colby, cheddar, muenster, Monterey Jack, Havarti, Brie, Camembert, American, Swiss, and blue. Creamed cottage cheese, cream cheese. Whole milk and whole milk products, including buttermilk or yogurt made from whole milk, drinks made from whole milk. Condensed milk, evaporated whole milk, and 2% milk. Soups and Combination Foods  Allowed: Low-fat low-sodium soups: broth, dehydrated soups, homemade broth, soups with the fat removed, homemade cream soups made with skim or low-fat milk. Low-fat spaghetti, lasagna, chili, and Spanish rice if low-fat ingredients and low-fat cooking techniques are used.  Avoid: Cream soups made with whole milk, cream, or high-fat cheese. All other soups. Desserts and Sweets  Allowed: Sherbet, fruit ices, gelatins, meringues, and angel food cake. Homemade desserts with recommended fats, oils, and milk products. Jam, jelly, honey, marmalade, sugars, and syrups. Pure sugar candy, such as gum drops, hard candy, jelly beans, marshmallows, mints, and small amounts of dark chocolate.  Avoid: Commercially prepared   cakes, pies, cookies, frosting, pudding, or mixes for these products. Desserts containing whole milk products, chocolate, coconut, lard, palm oil, or palm kernel oil. Ice cream or ice cream drinks. Candy that contains chocolate, coconut, butter, hydrogenated fat, or unknown ingredients. Buttered syrups. Fats and Oils  Allowed: Vegetable oils: safflower, sunflower, corn, soybean, cottonseed, sesame, canola, olive,  or peanut. Non-hydrogenated margarines. Salad dressing or mayonnaise: homemade or commercial, made with a recommended oil. Low or nonfat salad dressing or mayonnaise.  Limit added fats and oils to 6 to 8 tsp per day (includes fats used in cooking, baking, salads, and spreads on bread). Remember to count the "hidden fats" in foods.  Avoid: Solid fats and shortenings: butter, lard, salt pork, bacon drippings. Gravy containing meat fat, shortening, or suet. Cocoa butter, coconut. Coconut oil, palm oil, palm kernel oil, or hydrogenated oils: these ingredients are often used in bakery products, nondairy creamers, whipped toppings, candy, and commercially fried foods. Read labels carefully. Salad dressings made of unknown oils, sour cream, or cheese, such as blue cheese and Roquefort. Cream, all kinds: half-and-half, light, heavy, or whipping. Sour cream or cream cheese (even if "light" or low-fat). Nondairy cream substitutes: coffee creamers and sour cream substitutes made with palm, palm kernel, hydrogenated oils, or coconut oil. Beverages  Allowed: Coffee (regular or decaffeinated), tea. Diet carbonated beverages, mineral water. Alcohol: Check with your caregiver. Moderation is recommended.  Avoid: Whole milk, regular sodas, and juice drinks with added sugar. Condiments  Allowed: All seasonings and condiments. Cocoa powder. "Cream" sauces made with recommended ingredients.  Avoid: Carob powder made with hydrogenated fats. SAMPLE MENU Breakfast   cup orange juice   cup oatmeal  1 slice toast  1 tsp margarine  1 cup skim milk Lunch  Turkey sandwich with 2 oz turkey, 2 slices bread  Lettuce and tomato slices  Fresh fruit  Carrot sticks  Coffee or tea Snack  Fresh fruit or low-fat crackers Dinner  3 oz lean ground beef  1 baked potato  1 tsp margarine   cup asparagus  Lettuce salad  1 tbs non-creamy dressing   cup peach slices  1 cup skim milk Document Released:  02/21/2008 Document Revised: 11/13/2011 Document Reviewed: 07/14/2013 Exit[employer] Patient Information 2015 [employer]. This information is not intended to replace advice given to you by your health care provider. Make sure you discuss any questions you have with your health care provider.  

## 2014-02-25 NOTE — Progress Notes (Signed)
Subjective:    Patient ID: Andrea Delgado, female    DOB: 1970-08-20, 43 y.o.   MRN: [id number]  HPI  43 year old white female, nonsmoker, is in today for complete physical exam. She has a history of hyperlipidemia, obesity, depression. Denies any concerns today. Does not routinely exercise. Declines influenza vaccine   Review of Systems  Constitutional: Negative.   Eyes: Negative.   Respiratory: Negative.   Cardiovascular: Negative.   Gastrointestinal: Negative.   Endocrine: Negative.   Genitourinary: Negative.   Musculoskeletal: Negative.   Skin: Negative.   Allergic/Immunologic: Negative.   Neurological: Negative.   Hematological: Negative.   Psychiatric/Behavioral: Negative.    Past Medical History  Diagnosis Date  . Depression   . Hyperlipidemia     History   Social History  . Marital Status: Single    Spouse Name: N/A    Number of Children: N/A  . Years of Education: N/A   Occupational History  . Not on file.   Social History Main Topics  . Smoking status: Never Smoker   . Smokeless tobacco: Never Used  . Alcohol Use: No  . Drug Use: No  . Sexual Activity: Not on file   Other Topics Concern  . Not on file   Social History Narrative  . No narrative on file    History reviewed. No pertinent past surgical history.  Family History  Problem Relation Age of Onset  . Alcohol abuse Mother   . Cirrhosis Mother   . Heart disease Father     Allergies  Allergen Reactions  . Penicillins     REACTION: rash    Current Outpatient Prescriptions on File Prior to Visit  Medication Sig Dispense Refill  . atorvastatin (LIPITOR) 20 MG tablet TAKE 1 TABLET DAILY  90 tablet  0  . levocetirizine (XYZAL) 5 MG tablet Take 1 tablet (5 mg total) by mouth every evening.  30 tablet  5  . levocetirizine (XYZAL) 5 MG tablet TAKE 1 TABLET EVERY EVENING  90 tablet  0  . levofloxacin (LEVAQUIN) 500 MG tablet Take 1 tablet (500 mg total) by mouth daily.  7 tablet  0    . mometasone (NASONEX) 50 MCG/ACT nasal spray Place 2 sprays into the nose daily.  17 g  4  . sertraline (ZOLOFT) 100 MG tablet TAKE 1 TABLET DAILY  90 tablet  0  . spironolactone (ALDACTONE) 25 MG tablet Take 25 mg by mouth daily.         No current facility-administered medications on file prior to visit.    BP 108/62  Pulse 74  Ht 5\' 3"  (1.6 m)  Wt 198 lb (89.812 kg)  BMI 35.08 kg/m2chart    Objective:   Physical Exam  Constitutional: She is oriented to person, place, and time. She appears well-developed and well-nourished.  HENT:  Head: Normocephalic.  Right Ear: External ear normal.  Left Ear: External ear normal.  Nose: Nose normal.  Mouth/Throat: Oropharynx is clear and moist.  Eyes: Conjunctivae and EOM are normal. Pupils are equal, round, and reactive to light.  Neck: Normal range of motion. Neck supple. No thyromegaly present.  Cardiovascular: Normal rate, regular rhythm and normal heart sounds.   Pulmonary/Chest: Effort normal and breath sounds normal.  Abdominal: Soft. Bowel sounds are normal.  Musculoskeletal: Normal range of motion.  Neurological: She is alert and oriented to person, place, and time. She has normal reflexes.  Skin: Skin is warm and dry.  Psychiatric: She has a normal mood  and affect.          Assessment & Plan:  Andrea Delgado was seen today for annual exam.  Diagnoses and associated orders for this visit:  Preventative health care - CMP - Lipid Panel - CBC with Differential - TSH  Essential hypertension - CMP - CBC with Differential - TSH  Allergic rhinitis, unspecified allergic rhinitis type - CBC with Differential  Depression - TSH  Pure hypercholesterolemia - CBC with Differential - POC Urinalysis Dipstick   Call the office with any questions or concerns. Recheck as scheduled and as needed.

## 2014-02-26 ENCOUNTER — Telehealth: Payer: Self-pay | Admitting: Family

## 2014-02-26 NOTE — Telephone Encounter (Signed)
emmi emailed °

## 2014-03-12 ENCOUNTER — Other Ambulatory Visit: Payer: Self-pay

## 2014-05-03 ENCOUNTER — Other Ambulatory Visit: Payer: Self-pay | Admitting: Family

## 2014-05-23 ENCOUNTER — Other Ambulatory Visit: Payer: Self-pay | Admitting: Family

## 2014-08-17 ENCOUNTER — Other Ambulatory Visit: Payer: Self-pay

## 2014-08-17 NOTE — Telephone Encounter (Signed)
Rx request for atorvastatin 20 mg tablet-Take 1 tablet by mouth daily #90  Pharm:  [facility]

## 2014-08-18 MED ORDER — ATORVASTATIN CALCIUM 20 MG PO TABS: 20.0000 mg | ORAL_TABLET | Freq: Every day | ORAL | Status: DC

## 2014-09-28 ENCOUNTER — Ambulatory Visit (INDEPENDENT_AMBULATORY_CARE_PROVIDER_SITE_OTHER): Payer: Self-pay | Admitting: Family Medicine

## 2014-09-28 DIAGNOSIS — R69 Illness, unspecified: Secondary | ICD-10-CM

## 2014-09-28 NOTE — Progress Notes (Signed)
Late cancel

## 2014-11-04 ENCOUNTER — Other Ambulatory Visit: Payer: Self-pay

## 2014-11-04 ENCOUNTER — Other Ambulatory Visit: Payer: Self-pay | Admitting: Family Medicine

## 2014-11-04 MED ORDER — SERTRALINE HCL 100 MG PO TABS: 100.0000 mg | ORAL_TABLET | Freq: Every day | ORAL | Status: DC

## 2014-11-04 MED ORDER — LEVOCETIRIZINE DIHYDROCHLORIDE 5 MG PO TABS: 5.0000 mg | ORAL_TABLET | Freq: Every evening | ORAL | Status: DC

## 2014-11-04 MED ORDER — ATORVASTATIN CALCIUM 20 MG PO TABS: 20.0000 mg | ORAL_TABLET | Freq: Every day | ORAL | Status: DC

## 2014-11-04 NOTE — Telephone Encounter (Signed)
Sent to the pharmacy for 90 days.  Pt scheduled to establish with Dr. Selena Batten on 01/06/15.

## 2015-01-04 ENCOUNTER — Other Ambulatory Visit: Payer: Self-pay

## 2015-01-04 DIAGNOSIS — Z1231 Encounter for screening mammogram for malignant neoplasm of breast: Secondary | ICD-10-CM

## 2015-01-06 ENCOUNTER — Encounter: Payer: Self-pay | Admitting: Family Medicine

## 2015-01-06 ENCOUNTER — Ambulatory Visit (INDEPENDENT_AMBULATORY_CARE_PROVIDER_SITE_OTHER): Payer: BLUE CROSS/BLUE SHIELD | Admitting: Family Medicine

## 2015-01-06 VITALS — BP 102/80 | HR 71 | Temp 97.7°F | Ht 63.0 in | Wt 214.0 lb

## 2015-01-06 DIAGNOSIS — Z7189 Other specified counseling: Secondary | ICD-10-CM

## 2015-01-06 DIAGNOSIS — F32A Depression, unspecified: Secondary | ICD-10-CM

## 2015-01-06 DIAGNOSIS — E669 Obesity, unspecified: Secondary | ICD-10-CM

## 2015-01-06 DIAGNOSIS — F329 Major depressive disorder, single episode, unspecified: Secondary | ICD-10-CM

## 2015-01-06 DIAGNOSIS — E785 Hyperlipidemia, unspecified: Secondary | ICD-10-CM

## 2015-01-06 DIAGNOSIS — L680 Hirsutism: Secondary | ICD-10-CM

## 2015-01-06 DIAGNOSIS — Z7689 Persons encountering health services in other specified circumstances: Secondary | ICD-10-CM

## 2015-01-06 NOTE — Patient Instructions (Signed)
BEFORE YOU LEAVE: -physical in 3-4 months - come fasting and we will do labs  We recommend the following healthy lifestyle measures: - eat a healthy diet consisting small portions of vegetables, fruits, beans, nuts, seeds, healthy meats such as white chicken and fish and whole grains.  - avoid fried foods, starches, sweets, fast food, processed foods, sodas, red meet and other fattening foods.  - get a least 150 minutes of aerobic exercise per week.

## 2015-01-06 NOTE — Progress Notes (Signed)
HPI:  Andrea Delgado is here to establish care. Used to see Padonda. Last PCP and physical: 02/2014; sees [facility] for her gyn physicals  Has the following chronic problems that require follow up and concerns today:  Hyperlipidemia: -for 5-10 years -on atorvastatin - tolerating well -no regular exercise and diet is not great  Depression and Anxiety: -she works from home and her parents whom have health issues live work her -more anxiety, mild depression -taking zoloft  daily -doing ok -no SI or hx of  Seasonal allergies: -takes allergy medications   Facial Hair: -sees and endocrinologist and is taking spironolactone  ROS negative for unless reported above: fevers, unintentional weight loss, hearing or vision loss, chest pain, palpitations, struggling to breath, hemoptysis, melena, hematochezia, hematuria, falls, loc, si, thoughts of self harm  Past Medical History  Diagnosis Date  . Depression   . Hyperlipidemia   . Hirsutism   . Seasonal allergies     Past Surgical History  Procedure Laterality Date  . Nasal sinus surgery      may 2015, Dr. Annalee Genta    Family History  Problem Relation Age of Onset  . Alcohol abuse Mother   . Cirrhosis Mother   . Heart disease Father   . Mental illness Father     dementia    Social History   Social History  . Marital Status: Single    Spouse Name: N/A  . Number of Children: N/A  . Years of Education: N/A   Social History Main Topics  . Smoking status: Never Smoker   . Smokeless tobacco: Never Used  . Alcohol Use: No  . Drug Use: No  . Sexual Activity: Not Asked   Other Topics Concern  . 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  atorvastatin (LIPITOR) 20 MG  tablet, Take 1 tablet (20 mg total) by mouth daily., Disp: 90 tablet, Rfl: 1 .  levocetirizine (XYZAL) 5 MG tablet, Take 1 tablet (5 mg total) by mouth every evening., Disp: 30 tablet, Rfl: 5 .  mometasone (NASONEX) 50 MCG/ACT nasal spray, Place 2 sprays into the nose daily., Disp: 17 g, Rfl: 4 .  sertraline (ZOLOFT) 100 MG tablet, Take 1 tablet (100 mg total) by mouth daily., Disp: 90 tablet, Rfl: 0 .  spironolactone (ALDACTONE) 25 MG tablet, Take 25 mg by mouth daily.  , Disp: , Rfl:   EXAM:  Filed Vitals:   01/06/15 1440  BP: 102/80  Pulse: 71  Temp: 97.7 F (36.5 C)    Body mass index is 37.92 kg/(m^2).  GENERAL: vitals reviewed and listed above, alert, oriented, appears well hydrated and in no acute distress  HEENT: atraumatic, conjunttiva clear, no obvious abnormalities on inspection of external nose and ears  NECK: no obvious masses on inspection  LUNGS: clear to auscultation bilaterally, no wheezes, rales or rhonchi, good air movement  CV: HRRR, no peripheral edema  MS: moves all extremities without noticeable abnormality  PSYCH: pleasant and cooperative, no obvious depression or anxiety  ASSESSMENT AND PLAN:  Discussed the following assessment and plan:  Encounter to establish care  Hyperlipemia  Obesity  Depression  Hirsutism -We reviewed the  PMH, PSH, FH, SH, Meds and Allergies. -We provided refills for any medications we will prescribe as needed. -We addressed current concerns per orders and patient instructions. -We have asked for records for pertinent exams, studies, vaccines and notes from previous providers. -We have advised patient to follow up per instructions below.   -Patient advised to return or notify a doctor immediately if symptoms worsen or persist or new concerns arise.  Patient Instructions  BEFORE YOU LEAVE: -physical in 3-4 months - come fasting and we will do labs  We recommend the following healthy lifestyle measures: - eat a  healthy diet consisting small portions of vegetables, fruits, beans, nuts, seeds, healthy meats such as white chicken and fish and whole grains.  - avoid fried foods, starches, sweets, fast food, processed foods, sodas, red meet and other fattening foods.  - get a least 150 minutes of aerobic exercise per week.       [name] R.

## 2015-01-06 NOTE — Progress Notes (Signed)
Pre visit review using our clinic review tool, if applicable. No additional management support is needed unless otherwise documented below in the visit note. 

## 2015-01-27 ENCOUNTER — Other Ambulatory Visit: Payer: Self-pay | Admitting: *Deleted

## 2015-01-27 MED ORDER — SERTRALINE HCL 100 MG PO TABS: 100.0000 mg | ORAL_TABLET | Freq: Every day | ORAL | Status: DC

## 2015-02-02 ENCOUNTER — Telehealth: Payer: Self-pay | Admitting: *Deleted

## 2015-02-02 MED ORDER — SERTRALINE HCL 100 MG PO TABS: 100.0000 mg | ORAL_TABLET | Freq: Every day | ORAL | Status: DC

## 2015-02-02 NOTE — Telephone Encounter (Signed)
Pt called and needs a refill of her Zoloft. A fax came last week from [facility]. Pt RX was sent to [facility] instead of [facility]. A new refill request is going to be faxed from [facility] for a 90 day supply . Medication needs to be sent to [facility]. Please advise MD. Thanks

## 2015-02-02 NOTE — Telephone Encounter (Signed)
Rx done and I left a message for the pt to return my call. 

## 2015-02-02 NOTE — Telephone Encounter (Signed)
Rx done. 

## 2015-02-02 NOTE — Telephone Encounter (Signed)
Patient informed. 

## 2015-02-07 ENCOUNTER — Ambulatory Visit
Admission: RE | Admit: 2015-02-07 | Discharge: 2015-02-07 | Disposition: A | Discharge status: Home / Self Care | Payer: BLUE CROSS/BLUE SHIELD | Source: Ambulatory Visit

## 2015-02-07 DIAGNOSIS — Z1231 Encounter for screening mammogram for malignant neoplasm of breast: Secondary | ICD-10-CM

## 2015-04-28 ENCOUNTER — Encounter: Payer: Self-pay | Admitting: Family Medicine

## 2015-04-28 ENCOUNTER — Ambulatory Visit (INDEPENDENT_AMBULATORY_CARE_PROVIDER_SITE_OTHER): Payer: BLUE CROSS/BLUE SHIELD | Admitting: Family Medicine

## 2015-04-28 VITALS — BP 102/80 | HR 84 | Temp 97.6°F | Ht 63.0 in | Wt 212.4 lb

## 2015-04-28 DIAGNOSIS — E785 Hyperlipidemia, unspecified: Secondary | ICD-10-CM | POA: Diagnosis not present

## 2015-04-28 DIAGNOSIS — F3342 Major depressive disorder, recurrent, in full remission: Secondary | ICD-10-CM

## 2015-04-28 DIAGNOSIS — Z0000 Encounter for general adult medical examination without abnormal findings: Secondary | ICD-10-CM | POA: Diagnosis not present

## 2015-04-28 DIAGNOSIS — E669 Obesity, unspecified: Secondary | ICD-10-CM

## 2015-04-28 DIAGNOSIS — L680 Hirsutism: Secondary | ICD-10-CM | POA: Diagnosis not present

## 2015-04-28 LAB — LIPID PANEL
Cholesterol: 189 mg/dL (ref 0–200)
HDL: 51.5 mg/dL (ref >39.00)
LDL Cholesterol: 117 mg/dL — ABNORMAL HIGH (ref 0–99)
NonHDL: 137.81
Total CHOL/HDL Ratio: 4
Triglycerides: 106 mg/dL (ref 0.0–149.0)
VLDL: 21.2 mg/dL (ref 0.0–40.0)

## 2015-04-28 LAB — BASIC METABOLIC PANEL
BUN: 16 mg/dL (ref 6–23)
CO2: 25 mEq/L (ref 19–32)
Calcium: 9 mg/dL (ref 8.4–10.5)
Chloride: 104 mEq/L (ref 96–112)
Creatinine, Ser: 0.73 mg/dL (ref 0.40–1.20)
GFR: 91.62 mL/min (ref >60.00)
Glucose, Bld: 96 mg/dL (ref 70–99)
Potassium: 4.1 mEq/L (ref 3.5–5.1)
Sodium: 135 mEq/L (ref 135–145)

## 2015-04-28 LAB — HEMOGLOBIN A1C: Hgb A1c MFr Bld: 5.6 % (ref 4.6–6.5)

## 2015-04-28 NOTE — Progress Notes (Signed)
Pre visit review using our clinic review tool, if applicable. No additional management support is needed unless otherwise documented below in the visit note. 

## 2015-04-28 NOTE — Patient Instructions (Signed)
BEFORE YOU LEAVE: -labs -follow up yearly and as needed  -We have ordered labs or studies at this visit. It can take up to 1-2 weeks for results and processing. We will contact you with instructions IF your results are abnormal. Normal results will be released to your [facility]. If you have not heard from us or can not find your results in [facility] in 2 weeks please contact our office.  We recommend the following healthy lifestyle measures: - eat a healthy whole foods diet consisting of regular small meals composed of vegetables, fruits, beans, nuts, seeds, healthy meats such as white chicken and fish and whole grains.  - avoid sweets, white starchy foods, fried foods, fast food, processed foods, sodas, red meet and other fattening foods.  - get a least 150-300 minutes of aerobic exercise per week.   Please schedule your pap and gynecology/breast exam with your gynecologist.  Vit D3 [phone] IU daily

## 2015-04-28 NOTE — Progress Notes (Signed)
HPI:  Here for CPE:  -Concerns and/or follow up today:   Hyperlipidemia: -for 5-10 years -on atorvastatin - tolerating well -no regular exercise and diet is not great  Depression and Anxiety: -reports is doing quite well currently -taking zoloft 100mg  daily -no SI or hx of  Seasonal allergies: -takes allergy medications, good most days  Facial Hair: -sees an endocrinologist  -taking spironolactone   -Diet: not great  -Exercise: no regular exercise  -Taking folic acid, vitamin D or calcium: no  -Diabetes and Dyslipidemia Screening:FASTING today  -Hx of HTN: no  -Vaccines: refuses flu vaccine  -pap history: sees Dr. Senaida Oresichardson in ob/gyn, declines to do here but agrees to call and schedule gyn exam  -wants STI testing (Hep C if born [id number]): no  -FH breast, colon or ovarian ca: see FH Last mammogram: done Last colon cancer screening: n/a  Does breast exam/health with gyn   -Alcohol, Tobacco, drug use: see social history  Review of Systems - no fevers, unintentional weight loss, vision loss, hearing loss, chest pain, sob, hemoptysis, melena, hematochezia, hematuria, genital discharge, changing or concerning skin lesions, bleeding, bruising, loc, thoughts of self harm or SI  Past Medical History  Diagnosis Date  . Depression   . Hyperlipidemia   . Hirsutism   . Seasonal allergies     Past Surgical History  Procedure Laterality Date  . Nasal sinus surgery      may 2015, Dr. Annalee GentaShoemaker    Family History  Problem Relation Age of Onset  . Alcohol abuse Mother   . Cirrhosis Mother   . Heart disease Father   . Mental illness Father     dementia    Social History   Social History  . Marital Status: Single    Spouse Name: N/A  . Number of Children: N/A  . Years of Education: N/A   Social History Main Topics  . Smoking status: Never Smoker   . Smokeless tobacco: Never Used  . Alcohol Use: No  . Drug Use: No  . Sexual Activity: Not Asked    Other Topics Concern  . 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  Ascorbic Acid (VITAMIN C PO), Take by mouth., Disp: , Rfl:  .  atorvastatin (LIPITOR) 20 MG tablet, Take 1 tablet (20 mg total) by mouth daily., Disp: 90 tablet, Rfl: 1 .  levocetirizine (XYZAL) 5 MG tablet, Take 1 tablet (5 mg total) by mouth every evening., Disp: 30 tablet, Rfl: 5 .  mometasone (NASONEX) 50 MCG/ACT nasal spray, Place 2 sprays into the nose daily., Disp: 17 g, Rfl: 4 .  Multiple Vitamin (MULTIVITAMINS PO), Take by mouth., Disp: , Rfl:  .  sertraline (ZOLOFT) 100 MG tablet, Take 1 tablet (100 mg total) by mouth daily., Disp: 90 tablet, Rfl: 0 .  spironolactone (ALDACTONE) 25 MG tablet, Take 25 mg by mouth daily.  , Disp: , Rfl:   EXAM:  Filed Vitals:   04/28/15 0819  BP: 102/80  Pulse: 84  Temp: 97.6 F (36.4 C)    GENERAL: vitals reviewed and listed below, alert, oriented, appears well hydrated and in no acute distress  HEENT: head atraumatic, PERRLA,  normal appearance of eyes, ears, nose and mouth. moist mucus membranes.  NECK: supple, no masses or lymphadenopathy  LUNGS: clear to auscultation bilaterally, no rales, rhonchi or wheeze  CV: HRRR, no peripheral edema or cyanosis, normal pedal pulses  BREAST: offered, declined  ABDOMEN: bowel sounds normal, soft, non tender to palpation, no masses, no rebound or guarding  GU: offered, declined  SKIN: offered, declined  MS: normal gait, moves all extremities normally  NEURO: CN II-XII grossly intact, normal muscle strength and sensation to light touch on extremities  PSYCH: normal affect, pleasant and cooperative  ASSESSMENT AND PLAN:  Discussed the following assessment and plan:  Visit for  preventive health examination  Obesity - Plan: Hemoglobin A1c  Hyperlipemia - Plan: Lipid Panel  Recurrent major depressive disorder, in full remission (HCC)  Hirsutism - Plan: Basic metabolic panel  -Discussed and advised all [place] preventive services health task force level A and B recommendations for age, sex and risks.  -Advised at least 150 minutes of exercise per week and a healthy diet low in saturated fats and sweets and consisting of fresh fruits and vegetables, lean meats such as fish and white chicken and whole grains.  -labs, studies and vaccines per orders this encounter  Orders Placed This Encounter  Procedures  . Lipid Panel  . Basic metabolic panel  . Hemoglobin A1c    Patient advised to return to clinic immediately if symptoms worsen or persist or new concerns.  Patient Instructions  BEFORE YOU LEAVE: -labs -follow up yearly and as needed  -We have ordered labs or studies at this visit. It can take up to 1-2 weeks for results and processing. We will contact you with instructions IF your results are abnormal. Normal results will be released to your [facility] If you have not heard from [place] or can not find your results in [facility] in 2 weeks please contact our office.  We recommend the following healthy lifestyle measures: - eat a healthy whole foods diet consisting of regular small meals composed of vegetables, fruits, beans, nuts, seeds, healthy meats such as white chicken and fish and whole grains.  - avoid sweets, white starchy foods, fried foods, fast food, processed foods, sodas, red meet and other fattening foods.  - get a least 150-300 minutes of aerobic exercise per week.   Please schedule your pap and gynecology/breast exam with your gynecologist.  Vit D3 [phone] IU daily         No Follow-up on file.  [name] R.

## 2015-05-04 ENCOUNTER — Other Ambulatory Visit: Payer: Self-pay | Admitting: Family Medicine

## 2015-05-07 ENCOUNTER — Other Ambulatory Visit: Payer: Self-pay | Admitting: Family Medicine

## 2015-07-12 ENCOUNTER — Other Ambulatory Visit: Payer: Self-pay | Admitting: Family Medicine

## 2015-09-21 DIAGNOSIS — E039 Hypothyroidism, unspecified: Secondary | ICD-10-CM | POA: Diagnosis not present

## 2015-09-21 DIAGNOSIS — L680 Hirsutism: Secondary | ICD-10-CM | POA: Diagnosis not present

## 2015-09-28 DIAGNOSIS — E039 Hypothyroidism, unspecified: Secondary | ICD-10-CM | POA: Diagnosis not present

## 2015-09-28 DIAGNOSIS — L680 Hirsutism: Secondary | ICD-10-CM | POA: Diagnosis not present

## 2015-11-28 DIAGNOSIS — E039 Hypothyroidism, unspecified: Secondary | ICD-10-CM | POA: Diagnosis not present

## 2015-12-28 ENCOUNTER — Other Ambulatory Visit: Payer: Self-pay | Admitting: Family Medicine

## 2015-12-30 ENCOUNTER — Other Ambulatory Visit: Payer: Self-pay | Admitting: Family Medicine

## 2015-12-30 DIAGNOSIS — Z1231 Encounter for screening mammogram for malignant neoplasm of breast: Secondary | ICD-10-CM

## 2016-02-08 ENCOUNTER — Ambulatory Visit
Admission: RE | Admit: 2016-02-08 | Discharge: 2016-02-08 | Disposition: A | Discharge status: Home / Self Care | Payer: BLUE CROSS/BLUE SHIELD | Source: Ambulatory Visit | Attending: Family Medicine | Admitting: Family Medicine

## 2016-02-08 DIAGNOSIS — Z1231 Encounter for screening mammogram for malignant neoplasm of breast: Secondary | ICD-10-CM

## 2016-04-11 ENCOUNTER — Other Ambulatory Visit: Payer: Self-pay | Admitting: Family Medicine

## 2016-04-27 ENCOUNTER — Ambulatory Visit (INDEPENDENT_AMBULATORY_CARE_PROVIDER_SITE_OTHER): Payer: BLUE CROSS/BLUE SHIELD | Admitting: Family Medicine

## 2016-04-27 ENCOUNTER — Encounter: Payer: Self-pay | Admitting: Family Medicine

## 2016-04-27 VITALS — BP 110/60 | HR 84 | Temp 98.3°F | Ht 63.0 in | Wt 215.3 lb

## 2016-04-27 DIAGNOSIS — L680 Hirsutism: Secondary | ICD-10-CM | POA: Diagnosis not present

## 2016-04-27 DIAGNOSIS — E039 Hypothyroidism, unspecified: Secondary | ICD-10-CM | POA: Diagnosis not present

## 2016-04-27 DIAGNOSIS — Z6838 Body mass index (BMI) 38.0-38.9, adult: Secondary | ICD-10-CM

## 2016-04-27 DIAGNOSIS — Z0000 Encounter for general adult medical examination without abnormal findings: Secondary | ICD-10-CM | POA: Diagnosis not present

## 2016-04-27 DIAGNOSIS — E785 Hyperlipidemia, unspecified: Secondary | ICD-10-CM | POA: Diagnosis not present

## 2016-04-27 HISTORY — DX: Hypothyroidism, unspecified: E03.9

## 2016-04-27 LAB — BASIC METABOLIC PANEL
BUN: 15 mg/dL (ref 6–23)
CO2: 24 mEq/L (ref 19–32)
Calcium: 9.4 mg/dL (ref 8.4–10.5)
Chloride: 104 mEq/L (ref 96–112)
Creatinine, Ser: 0.72 mg/dL (ref 0.40–1.20)
GFR: 92.67 mL/min (ref >60.00)
Glucose, Bld: 94 mg/dL (ref 70–99)
Potassium: 4.3 mEq/L (ref 3.5–5.1)
Sodium: 137 mEq/L (ref 135–145)

## 2016-04-27 LAB — LIPID PANEL
Cholesterol: 186 mg/dL (ref 0–200)
HDL: 57.3 mg/dL (ref >39.00)
LDL Cholesterol: 108 mg/dL — ABNORMAL HIGH (ref 0–99)
NonHDL: 128.28
Total CHOL/HDL Ratio: 3
Triglycerides: 99 mg/dL (ref 0.0–149.0)
VLDL: 19.8 mg/dL (ref 0.0–40.0)

## 2016-04-27 LAB — TSH: TSH: 1.6 u[IU]/mL (ref 0.35–4.50)

## 2016-04-27 NOTE — Progress Notes (Signed)
Pre visit review using our clinic review tool, if applicable. No additional management support is needed unless otherwise documented below in the visit note. 

## 2016-04-27 NOTE — Progress Notes (Signed)
HPI:  Andrea Delgado is a pleasant 45 yo here for her preventive visit. She has a past medical history significant for obesity, hyperlipidemia, depression, anxiety and hirsutism. She was recently diagnosed with mild hypothyroidism by her endocrinologist, Dr. Talmage Nap, whom she sees for hirsutism. She is on a low dose of synthroid and wants to recheck level as has been since the summer that she checked labs with Dr. Talmage Nap. She feels fine without any symptoms though struggles with controlling diet and weight. No regular exercise. Otherwise, she continues her cholesterol medications, Zoloft and Aldactone and feels well without any complaints today.   -Concerns and/or follow up today:   Hyperlipidemia: -for 5-10 years -on atorvastatin  -no regular exercise and diet is not great  Depression and Anxiety: -taking zoloft 100mg  daily -no SI or hx of  Seasonal allergies: -takes allergy medications  Facial Hair: -sees an endocrinologist  -taking spironolactone  -Taking folic acid, vitamin D or calcium: Taking vitamin D  -Diabetes and Dyslipidemia Screening: Fasting for labs today  -Hx of HTN: no  -Vaccines: declined flu vaccine  -pap history: does with gyn, Dr. Senaida Ores - due and refused to do here, she agrees to call her gynecology office to schedule  -FDLMP: 3 weeks ago, normal and regular  -sexual activity: yes, female partner, no new partners  -wants STI testing (Hep C if born [birth year]): no, declined  -FH breast, colon or ovarian ca: see FH Last mammogram: 01/2016 Last colon cancer screening: n/a  -Alcohol, Tobacco, drug use: see social history  Review of Systems - no fevers, unintentional weight loss, vision loss, hearing loss, chest pain, sob, hemoptysis, melena, hematochezia, hematuria, genital discharge, changing or concerning skin lesions, bleeding, bruising, loc, thoughts of self harm or SI  Past Medical History:  Diagnosis Date  . Depression   . Hirsutism   .  Hyperlipidemia   . Hypothyroidism 04/27/2016   -Seeing Dr. Talmage Nap endocrinology -Diagnosed in 2017 with her endocrinologist   . Seasonal allergies     Past Surgical History:  Procedure Laterality Date  . NASAL SINUS SURGERY     may 2015, Dr. Annalee Genta    Family History  Problem Relation Age of Onset  . Alcohol abuse Mother   . Cirrhosis Mother   . Heart disease Father   . Mental illness Father     dementia    Social History   Social History  . Marital status: Single    Spouse name: N/A  . Number of children: N/A  . Years of education: N/A   Social History Main Topics  . Smoking status: Never Smoker  . Smokeless tobacco: Never Used  . Alcohol use No  . Drug use: No  . Sexual activity: Not Asked   Other Topics Concern  . 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  Ascorbic Acid (VITAMIN C PO), Take by mouth., Disp: , Rfl:  .  atorvastatin (LIPITOR) 20 MG tablet, TAKE 1 TABLET DAILY, Disp: 90 tablet, Rfl: 0 .  levocetirizine (XYZAL) 5 MG tablet, TAKE 1 TABLET EVERY EVENING, Disp: 90 tablet, Rfl: 0 .  mometasone (NASONEX) 50 MCG/ACT nasal spray, Place 2 sprays into the nose daily.,  Disp: 17 g, Rfl: 4 .  Multiple Vitamin (MULTIVITAMINS PO), Take by mouth., Disp: , Rfl:  .  sertraline (ZOLOFT) 100 MG tablet, TAKE 1 TABLET DAILY, Disp: 90 tablet, Rfl: 0 .  spironolactone (ALDACTONE) 25 MG tablet, Take 25 mg by mouth daily.  , Disp: , Rfl:  .  SYNTHROID 50 MCG tablet, , Disp: , Rfl:   EXAM:  Vitals:   04/27/16 0920  BP: 110/60  Pulse: 84  Temp: 98.3 F (36.8 C)   Body mass index is 38.14 kg/m.  GENERAL: vitals reviewed and listed below, alert, oriented, appears well hydrated and in no acute distress  HEENT: head atraumatic,  PERRLA, normal appearance of eyes, ears, nose and mouth. moist mucus membranes.  NECK: supple, no masses or lymphadenopathy  LUNGS: clear to auscultation bilaterally, no rales, rhonchi or wheeze  CV: HRRR, no peripheral edema or cyanosis, normal pedal pulses  BREAST: Declined, she prefers to do with her gynecologist  ABDOMEN: bowel sounds normal, soft, non tender to palpation, no masses, no rebound or guarding  GU: Offered today, she refused and sensitivity with her gynecologist, reports normal in 2013  SKIN: no rash or abnormal lesions -she declined full skin check  MS: normal gait, moves all extremities normally  NEURO: normal gait, speech and thought processing grossly intact, muscle tone grossly intact throughout  PSYCH: normal affect, pleasant and cooperative  ASSESSMENT AND PLAN:  Discussed the following assessment and plan:  Encounter for preventive health examination  Hypothyroidism, unspecified type - Plan: TSH  Hyperlipidemia, unspecified hyperlipidemia type - Plan: Lipid Panel  BMI 38.0-38.9,adult  Hirsutism - Plan: Basic metabolic panel  -Advised she call her gynecologist today to set up her Pap smear and breast exam since she refused to do these here today  -Fasting labs  -Patient  -Discussed and advised all [place] preventive services health task force level A and B recommendations for age, sex and risks.  -Advised at least 150 minutes of exercise per week and a healthy diet with avoidance of (less then 1 serving per week) processed foods, white starches, red meat, fast foods and sweets and consisting of: * 5-9 servings of fresh fruits and vegetables (not corn or potatoes) *nuts and seeds, beans *olives and olive oil *lean meats such as fish and white chicken  *whole grains   Orders Placed This Encounter  Procedures  . Basic metabolic panel  . TSH  . Lipid Panel    Patient advised to return to clinic immediately if symptoms worsen or persist or new  concerns.  Patient Instructions  BEFORE YOU LEAVE: -follow up: 4-6 months -labs  Call your gynecology office today to schedule your annual visit.  We have ordered labs or studies at this visit. It can take up to 1-2 weeks for results and processing. IF results require follow up or explanation, we will call you with instructions. Clinically stable results will be released to your [facility]. If you have not heard from us or cannot find your results in [facility] in 2 weeks please contact our office at [phone].    We recommend the following healthy lifestyle for a healthy LIFE: 1) Small portions.   Tip: eat off of a salad plate instead of a dinner plate.  Tip: if you need more or a snack choose fruits, veggies and/or a handful of nuts or seeds.  2) Eat a healthy clean diet.  * Tip: Avoid (less then 1 serving per week): processed foods, sweets, sweetened drinks, white starches (rice,  flour, bread, potatoes, pasta, etc), red meat, fast foods, butter  *Tip: CHOOSE instead   * 5-9 servings per day of fresh or frozen fruits and vegetables (but not corn, potatoes, bananas, canned or dried fruit)   *nuts and seeds, beans   *olives and olive oil   *small portions of lean meats such as fish and white chicken    *small portions of whole grains  3)Get at least 150 minutes of sweaty aerobic exercise per week.  4)Reduce stress - consider counseling, meditation and relaxation to balance other aspects of your life.   If you are not yet signed up for [facility], please SIGN UP TODAY. We now offer online scheduling, same day appointments and extended hours. WHEN YOU DON'T FEEL YOUR BEST.Marland Kitchen.Marland Kitchen.WE ARE HERE TO HELP.          No Follow-up on file.  Kriste BasqueKIM, HANNAH R., DO

## 2016-04-27 NOTE — Patient Instructions (Signed)
BEFORE YOU LEAVE: -follow up: 4-6 months -labs  Call your gynecology office today to schedule your annual visit.  We have ordered labs or studies at this visit. It can take up to 1-2 weeks for results and processing. IF results require follow up or explanation, we will call you with instructions. Clinically stable results will be released to your [facility]. If you have not heard from us or cannot find your results in [facility] in 2 weeks please contact our office at [phone].    We recommend the following healthy lifestyle for a healthy LIFE: 1) Small portions.   Tip: eat off of a salad plate instead of a dinner plate.  Tip: if you need more or a snack choose fruits, veggies and/or a handful of nuts or seeds.  2) Eat a healthy clean diet.  * Tip: Avoid (less then 1 serving per week): processed foods, sweets, sweetened drinks, white starches (rice, flour, bread, potatoes, pasta, etc), red meat, fast foods, butter  *Tip: CHOOSE instead   * 5-9 servings per day of fresh or frozen fruits and vegetables (but not corn, potatoes, bananas, canned or dried fruit)   *nuts and seeds, beans   *olives and olive oil   *small portions of lean meats such as fish and white chicken    *small portions of whole grains  3)Get at least 150 minutes of sweaty aerobic exercise per week.  4)Reduce stress - consider counseling, meditation and relaxation to balance other aspects of your life.   If you are not yet signed up for [facility], please SIGN UP TODAY. We now offer online scheduling, same day appointments and extended hours. WHEN YOU DON'T FEEL YOUR BEST.Marland Kitchen.Marland Kitchen.WE ARE HERE TO HELP.

## 2016-05-24 ENCOUNTER — Ambulatory Visit (INDEPENDENT_AMBULATORY_CARE_PROVIDER_SITE_OTHER): Payer: BLUE CROSS/BLUE SHIELD | Admitting: Family Medicine

## 2016-05-24 ENCOUNTER — Encounter: Payer: Self-pay | Admitting: Family Medicine

## 2016-05-24 VITALS — BP 112/80 | HR 90 | Temp 98.6°F | Ht 63.0 in

## 2016-05-24 DIAGNOSIS — J069 Acute upper respiratory infection, unspecified: Secondary | ICD-10-CM

## 2016-05-24 MED ORDER — BENZONATATE 100 MG PO CAPS: 100.0000 mg | ORAL_CAPSULE | Freq: Two times a day (BID) | ORAL | 0 refills | Status: DC | PRN

## 2016-05-24 MED ORDER — DOXYCYCLINE HYCLATE 100 MG PO CAPS: 100.0000 mg | ORAL_CAPSULE | Freq: Two times a day (BID) | ORAL | 0 refills | Status: DC

## 2016-05-24 NOTE — Patient Instructions (Signed)
INSTRUCTIONS FOR UPPER RESPIRATORY INFECTION:  -use they antibiotic only ir worsening or not improving over the next 1 week with the treatments below  -nasal saline wash 2-3 times daily (use prepackaged nasal saline or bottled/distilled water if making your own)   -can use AFRIN nasal spray for drainage and nasal congestion for 3-4 days - but do NOT use longer then 3-4 days  -can use tylenol (in no history of liver disease) or ibuprofen (if no history of kidney disease, bowel bleeding or significant heart disease) as directed for aches and sorethroat  -in the winter time, using a humidifier at night is helpful (please follow cleaning instructions)  -if you are taking a cough medication - use only as directed, may also try a teaspoon of honey to coat the throat and throat lozenges. I sent the tessalon for the cough as well.  -for sore throat, salt water gargles can help  -follow up if you have fevers, facial pain, tooth pain, difficulty breathing or are worsening or symptoms persist longer then expected  Upper Respiratory Infection, Adult An upper respiratory infection (URI) is also known as the common cold. It is often caused by a type of germ (virus). Colds are easily spread (contagious). You can pass it to others by kissing, coughing, sneezing, or drinking out of the same glass. Usually, you get better in 1 to 3  weeks.  However, the cough can last for even longer. HOME CARE   Only take medicine as told by your doctor. Follow instructions provided above.  Drink enough water and fluids to keep your pee (urine) clear or pale yellow.  Get plenty of rest.  Return to work when your temperature is < 100 for 24 hours or as told by your doctor. You may use a face mask and wash your hands to stop your cold from spreading. GET HELP RIGHT AWAY IF:   After the first few days, you feel you are getting worse.  You have questions about your medicine.  You have chills, shortness of breath, or  red spit (mucus).  You have pain in the face for more then 1-2 days, especially when you bend forward.  You have a fever, puffy (swollen) neck, pain when you swallow, or white spots in the back of your throat.  You have a bad headache, ear pain, sinus pain, or chest pain.  You have a high-pitched whistling sound when you breathe in and out (wheezing).  You cough up blood.  You have sore muscles or a stiff neck. MAKE SURE YOU:   Understand these instructions.  Will watch your condition.  Will get help right away if you are not doing well or get worse. Document Released: 10/31/2007 Document Revised: 08/06/2011 Document Reviewed: 08/19/2013 [facility] Patient Information 2015 [place]. This information is not intended to replace advice given to you by your health care provider. Make sure you discuss any questions you have with your health care provider.

## 2016-05-24 NOTE — Progress Notes (Signed)
HPI:  URI: -started: 1 week ago -symptoms:nasal congestion, sore throat, cough, laryngitis -denies:fever, SOB, NVD, tooth pain -has tried: musinex D -sick contacts/travel/risks: no reported flu, strep or tick exposure -Hx of: allergies, hx sinus infection  ROS: See pertinent positives and negatives per HPI.  Past Medical History:  Diagnosis Date  . Depression   . Hirsutism   . Hyperlipidemia   . Hypothyroidism 04/27/2016   -Seeing Dr. Talmage NapBalan endocrinology -Diagnosed in 2017 with her endocrinologist   . Seasonal allergies     Past Surgical History:  Procedure Laterality Date  . NASAL SINUS SURGERY     may 2015, Dr. Annalee GentaShoemaker    Family History  Problem Relation Age of Onset  . Alcohol abuse Mother   . Cirrhosis Mother   . Heart disease Father   . Mental illness Father     dementia    Social History   Social History  . Marital status: Single    Spouse name: N/A  . Number of children: N/A  . Years of education: N/A   Social History Main Topics  . Smoking status: Never Smoker  . Smokeless tobacco: Never Used  . Alcohol use No  . Drug use: No  . Sexual activity: Not Asked   Other Topics Concern  . 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  Ascorbic Acid (VITAMIN C PO), Take by mouth., Disp: , Rfl:  .  atorvastatin (LIPITOR) 20 MG tablet, TAKE 1 TABLET DAILY, Disp: 90 tablet, Rfl: 0 .  levocetirizine (XYZAL) 5 MG tablet, TAKE 1 TABLET EVERY EVENING, Disp: 90 tablet, Rfl: 0 .  mometasone (NASONEX) 50 MCG/ACT nasal spray, Place 2 sprays into the nose daily., Disp: 17 g, Rfl: 4 .  Multiple Vitamin (MULTIVITAMINS PO), Take by mouth., Disp: , Rfl:  .  sertraline (ZOLOFT) 100 MG tablet, TAKE 1 TABLET DAILY, Disp: 90 tablet, Rfl:  0 .  spironolactone (ALDACTONE) 25 MG tablet, Take 25 mg by mouth daily.  , Disp: , Rfl:  .  SYNTHROID 50 MCG tablet, , Disp: , Rfl:  .  benzonatate (TESSALON) 100 MG capsule, Take 1 capsule (100 mg total) by mouth 2 (two) times daily as needed for cough., Disp: 20 capsule, Rfl: 0 .  doxycycline (VIBRAMYCIN) 100 MG capsule, Take 1 capsule (100 mg total) by mouth 2 (two) times daily., Disp: 20 capsule, Rfl: 0  EXAM:  Vitals:   05/24/16 1550  BP: 112/80  Pulse: 90  Temp: 98.6 F (37 C)    There is no height or weight on file to calculate BMI.  GENERAL: vitals reviewed and listed above, alert, oriented, appears well hydrated and in no acute distress  HEENT: atraumatic, conjunttiva clear, no obvious abnormalities on inspection of external nose and ears, normal appearance of ear canals and TMs, clear nasal congestion, mild post oropharyngeal erythema with PND, no tonsillar edema or exudate, no sinus TTP  NECK: no obvious masses on inspection  LUNGS: clear to auscultation bilaterally, no wheezes, rales or rhonchi, good air movement  CV: HRRR, no peripheral edema  MS: moves all extremities without noticeable abnormality  PSYCH: pleasant and cooperative, no obvious depression or anxiety  ASSESSMENT AND PLAN:  Discussed the following assessment and plan:  Acute upper respiratory infection  -given HPI and exam findings today, a serious infection or illness is unlikely. We discussed potential etiologies, with VURI being most likely, and advised supportive care and monitoring. We discussed treatment side effects, likely course, antibiotic misuse, transmission, and signs of developing a serious illness. She is very concerned she may be developing a sinus infection and reports in the past she always got levaquin for this. Discussed again signs and symptoms of viral vs bacterial infection, abx choices and risks. Opted for saline, tessalon, short course nasal decongestant - abx only if not  improving. -of course, we advised to return or notify a doctor immediately if symptoms worsen or persist or new concerns arise.    Patient Instructions  INSTRUCTIONS FOR UPPER RESPIRATORY INFECTION:  -use they antibiotic only ir worsening or not improving over the next 1 week with the treatments below  -nasal saline wash 2-3 times daily (use prepackaged nasal saline or bottled/distilled water if making your own)   -can use AFRIN nasal spray for drainage and nasal congestion for 3-4 days - but do NOT use longer then 3-4 days  -can use tylenol (in no history of liver disease) or ibuprofen (if no history of kidney disease, bowel bleeding or significant heart disease) as directed for aches and sorethroat  -in the winter time, using a humidifier at night is helpful (please follow cleaning instructions)  -if you are taking a cough medication - use only as directed, may also try a teaspoon of honey to coat the throat and throat lozenges. I sent the tessalon for the cough as well.  -for sore throat, salt water gargles can help  -follow up if you have fevers, facial pain, tooth pain, difficulty breathing or are worsening or symptoms persist longer then expected  Upper Respiratory Infection, Adult An upper respiratory infection (URI) is also known as the common cold. It is often caused by a type of germ (virus). Colds are easily spread (contagious). You can pass it to others by kissing, coughing, sneezing, or drinking out of the same glass. Usually, you get better in 1 to 3  weeks.  However, the cough can last for even longer. HOME CARE   Only take medicine as told by your doctor. Follow instructions provided above.  Drink enough water and fluids to keep your pee (urine) clear or pale yellow.  Get plenty of rest.  Return to work when your temperature is < 100 for 24 hours or as told by your doctor. You may use a face mask and wash your hands to stop your cold from spreading. GET HELP RIGHT  AWAY IF:   After the first few days, you feel you are getting worse.  You have questions about your medicine.  You have chills, shortness of breath, or red spit (mucus).  You have pain in the face for more then 1-2 days, especially when you bend forward.  You have a fever, puffy (swollen) neck, pain when you swallow, or white spots in the back of your throat.  You have a bad headache, ear pain, sinus pain, or chest pain.  You have a high-pitched whistling sound when you breathe in and out (wheezing).  You cough up blood.  You have sore muscles or a stiff neck. MAKE SURE YOU:   Understand these instructions.  Will watch your condition.  Will get help right away if you are not doing well or get worse.  Document Released: 10/31/2007 Document Revised: 08/06/2011 Document Reviewed: 08/19/2013 [facility] Patient Information 2015 [place], [place]. This information is not intended to replace advice given to you by your health care provider. Make sure you discuss any questions you have with your health care provider.    Colin Benton R., DO

## 2016-05-24 NOTE — Progress Notes (Signed)
Pre visit review using our clinic review tool, if applicable. No additional management support is needed unless otherwise documented below in the visit note. 

## 2016-06-01 ENCOUNTER — Telehealth: Payer: Self-pay | Admitting: Family Medicine

## 2016-06-01 DIAGNOSIS — T7840XA Allergy, unspecified, initial encounter: Secondary | ICD-10-CM | POA: Diagnosis not present

## 2016-06-01 NOTE — Telephone Encounter (Signed)
Pt state that she is allergic to the doxycycline and would like to know if she should do something other than stop taking the medicine and she is taking benadryl per the pharmacy.  Swollen glands and heartburn.  Pt would like to have a call to discuss in detail.

## 2016-06-01 NOTE — Telephone Encounter (Signed)
Pt following up on call. Pt hopes she will hear something today.

## 2016-06-01 NOTE — Telephone Encounter (Signed)
Spoke with pt. Took doxy for last few days as was still having some sinus issues.she felt like she had something stuck in her throat after taking a pill. Yesterday felt like her glands were swollen too. Pharmacist told her might be having allergic reaction.  Still has a sore throat. She feels like her throat is closing up since yesterday and she thinks she is having an allergic reaction. Benadryl is not helping. She thinks she is having some difficulty swallowing as well. Denies any rash or breathing difficulty. [facility] for eval to determine if tonsillitis, pill dysphagia, allergic reaction vs other. She agreed to go.

## 2016-07-01 ENCOUNTER — Other Ambulatory Visit: Payer: Self-pay | Admitting: Family Medicine

## 2016-09-20 DIAGNOSIS — E039 Hypothyroidism, unspecified: Secondary | ICD-10-CM | POA: Diagnosis not present

## 2016-09-20 DIAGNOSIS — L680 Hirsutism: Secondary | ICD-10-CM | POA: Diagnosis not present

## 2016-09-27 DIAGNOSIS — E039 Hypothyroidism, unspecified: Secondary | ICD-10-CM | POA: Diagnosis not present

## 2016-09-27 DIAGNOSIS — L680 Hirsutism: Secondary | ICD-10-CM | POA: Diagnosis not present

## 2016-10-08 ENCOUNTER — Other Ambulatory Visit: Payer: Self-pay | Admitting: Family Medicine

## 2016-11-16 DIAGNOSIS — J310 Chronic rhinitis: Secondary | ICD-10-CM | POA: Diagnosis not present

## 2016-12-24 ENCOUNTER — Other Ambulatory Visit: Payer: Self-pay | Admitting: Family Medicine

## 2016-12-24 DIAGNOSIS — Z1231 Encounter for screening mammogram for malignant neoplasm of breast: Secondary | ICD-10-CM

## 2016-12-28 DIAGNOSIS — E039 Hypothyroidism, unspecified: Secondary | ICD-10-CM | POA: Diagnosis not present

## 2016-12-28 DIAGNOSIS — L680 Hirsutism: Secondary | ICD-10-CM | POA: Diagnosis not present

## 2017-01-09 ENCOUNTER — Other Ambulatory Visit: Payer: Self-pay | Admitting: Family Medicine

## 2017-01-11 ENCOUNTER — Encounter: Payer: Self-pay | Admitting: Family Medicine

## 2017-01-31 DIAGNOSIS — H8110 Benign paroxysmal vertigo, unspecified ear: Secondary | ICD-10-CM | POA: Diagnosis not present

## 2017-02-11 ENCOUNTER — Ambulatory Visit
Admission: RE | Admit: 2017-02-11 | Discharge: 2017-02-11 | Disposition: A | Discharge status: Home / Self Care | Payer: BLUE CROSS/BLUE SHIELD | Source: Ambulatory Visit | Attending: Family Medicine | Admitting: Family Medicine

## 2017-02-11 DIAGNOSIS — Z1231 Encounter for screening mammogram for malignant neoplasm of breast: Secondary | ICD-10-CM | POA: Diagnosis not present

## 2017-02-14 ENCOUNTER — Encounter: Payer: Self-pay | Admitting: Family Medicine

## 2017-02-25 DIAGNOSIS — Z124 Encounter for screening for malignant neoplasm of cervix: Secondary | ICD-10-CM | POA: Diagnosis not present

## 2017-02-25 DIAGNOSIS — Z1151 Encounter for screening for human papillomavirus (HPV): Secondary | ICD-10-CM | POA: Diagnosis not present

## 2017-02-25 DIAGNOSIS — Z1389 Encounter for screening for other disorder: Secondary | ICD-10-CM | POA: Diagnosis not present

## 2017-02-25 DIAGNOSIS — Z130 Encounter for screening for diseases of the blood and blood-forming organs and certain disorders involving the immune mechanism: Secondary | ICD-10-CM | POA: Diagnosis not present

## 2017-02-25 DIAGNOSIS — N939 Abnormal uterine and vaginal bleeding, unspecified: Secondary | ICD-10-CM | POA: Diagnosis not present

## 2017-02-25 DIAGNOSIS — Z01419 Encounter for gynecological examination (general) (routine) without abnormal findings: Secondary | ICD-10-CM | POA: Diagnosis not present

## 2017-02-25 DIAGNOSIS — Z6841 Body Mass Index (BMI) 40.0 and over, adult: Secondary | ICD-10-CM | POA: Diagnosis not present

## 2017-02-26 DIAGNOSIS — Z124 Encounter for screening for malignant neoplasm of cervix: Secondary | ICD-10-CM | POA: Diagnosis not present

## 2017-03-27 ENCOUNTER — Other Ambulatory Visit: Payer: Self-pay | Admitting: Family Medicine

## 2017-03-28 ENCOUNTER — Other Ambulatory Visit: Payer: Self-pay | Admitting: *Deleted

## 2017-03-28 ENCOUNTER — Other Ambulatory Visit: Payer: Self-pay | Admitting: Family Medicine

## 2017-03-28 MED ORDER — ATORVASTATIN CALCIUM 20 MG PO TABS: ORAL_TABLET | ORAL | 0 refills | Status: DC

## 2017-03-28 NOTE — Telephone Encounter (Signed)
Rx done. 

## 2017-04-29 DIAGNOSIS — J329 Chronic sinusitis, unspecified: Secondary | ICD-10-CM | POA: Diagnosis not present

## 2017-05-06 ENCOUNTER — Encounter: Payer: BLUE CROSS/BLUE SHIELD | Admitting: Family Medicine

## 2017-05-06 ENCOUNTER — Other Ambulatory Visit: Payer: Self-pay | Admitting: Family Medicine

## 2017-06-01 ENCOUNTER — Other Ambulatory Visit: Payer: Self-pay | Admitting: Family Medicine

## 2017-06-06 ENCOUNTER — Encounter: Payer: Self-pay | Admitting: Family Medicine

## 2017-06-15 NOTE — Progress Notes (Signed)
HPI:  Here for CPE:  -Concerns and/or follow up today:   Andrea Delgado is a pleasant 47 y.o. here for follow up. Chronic medical problems summarized below were reviewed for changes and stability and were updated as needed below. She chooses to follow up on her chronic conditions at her physical rather then at a separate appointment.These issues and their treatment remain stable for the most part.  Reports mood has been okay, though stress has been high caring for her father who has dementia, recently placed in a nursing home.  Months to continue the Zoloft for now.  No severe depression, suicidal ideation or thoughts of harm.  She has gained some weight as her diet and exercise have not been as good.  She wants refills on her atorvastatin sent to the mail in pharmacy. Denies CP, SOB, DOE, treatment intolerance or new symptoms. Due for flu shot and labs.  She refuses a flu shot today.  Hyperlipidemia: -for 5-10 years -on atorvastatin  -no regular exercise and diet is not great  Depression and Anxiety: -taking zoloft 170m daily -no SI or hx of  Seasonal allergies: -takes allergy medications  Hypothyroidism/Hirsutism -sees an endocrinologist, Dr. BChalmers Cater-taking spironolactone, sythroid  -Diet: variety of foods, balance and well rounded, larger portion sizes -Exercise: no regular exercise -Taking folic acid, vitamin D or calcium: no -Diabetes and Dyslipidemia Screening: Fasting for labs -Vaccines: see vaccine section EPIC -pap history:  Sees Dr. RMarvel Plan gyn -FDLMP: see nursing notes -sexual activity: yes, female partner, no new partners -wants STI testing (Hep C if born [birth year]: no -FH breast, colon or ovarian ca: see FH Last mammogram: sees Dr. RTammi Sou gyn Last colon cancer screening: n/a Breast Ca Risk Assessment: see family history and pt history DEXA (>/= 673: n/a  -Alcohol, Tobacco, drug use: see social history  Review of Systems - no fevers, unintentional  weight loss, vision loss, hearing loss, chest pain, sob, hemoptysis, melena, hematochezia, hematuria, genital discharge, changing or concerning skin lesions, bleeding, bruising, loc, thoughts of self harm or SI  Past Medical History:  Diagnosis Date  . Depression   . Hirsutism   . Hyperlipidemia   . Hypothyroidism 04/27/2016   -Seeing Dr. BChalmers Caterendocrinology -Diagnosed in 2017 with her endocrinologist   . Seasonal allergies     Past Surgical History:  Procedure Laterality Date  . NASAL SINUS SURGERY     may 2015, Dr. SWilburn Cornelia   Family History  Problem Relation Age of Onset  . Alcohol abuse Mother   . Cirrhosis Mother   . Heart disease Father   . Mental illness Father        dementia    Social History   Socioeconomic History  . Marital status: Single    Spouse name: None  . Number of children: None  . Years of education: None  . Highest education level: None  Social Needs  . Financial resource strain: None  . Food insecurity - worry: None  . Food insecurity - inability: None  . Transportation needs - medical: None  . Transportation needs - non-medical: None  Occupational History  . None  Tobacco Use  . Smoking status: Never Smoker  . Smokeless tobacco: Never Used  Substance and Sexual Activity  . Alcohol use: No  . Drug use: No  . Sexual activity: None  Other Topics Concern  . 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  Ascorbic Acid (VITAMIN C PO), Take by mouth., Disp: , Rfl:  .  atorvastatin (LIPITOR) 20 MG tablet, Take 1 tablet (20 mg total) by mouth daily., Disp: 90 tablet, Rfl: 3 .  levocetirizine (XYZAL) 5 MG tablet, TAKE 1 TABLET EVERY EVENING, Disp: 90 tablet, Rfl: 0 .  mometasone (NASONEX) 50 MCG/ACT nasal spray, Place 2  sprays into the nose daily., Disp: 17 g, Rfl: 4 .  Multiple Vitamin (MULTIVITAMINS PO), Take by mouth., Disp: , Rfl:  .  sertraline (ZOLOFT) 100 MG tablet, TAKE 1 TABLET DAILY (NEED AN APPOINTMENT), Disp: 90 tablet, Rfl: 0 .  spironolactone (ALDACTONE) 25 MG tablet, Take 25 mg by mouth daily.  , Disp: , Rfl:  .  SYNTHROID 50 MCG tablet, , Disp: , Rfl:   EXAM:  Vitals:   06/17/17 0859  BP: 110/80  Pulse: 85  Temp: 98.6 F (37 C)  Body mass index is 39.41 kg/m.   GENERAL: vitals reviewed and listed below, alert, oriented, appears well hydrated and in no acute distress  HEENT: head atraumatic, PERRLA, normal appearance of eyes, ears, nose and mouth. moist mucus membranes.  NECK: supple, no masses or lymphadenopathy  LUNGS: clear to auscultation bilaterally, no rales, rhonchi or wheeze  CV: HRRR, no peripheral edema or cyanosis, normal pedal pulses  ABDOMEN: bowel sounds normal, soft, non tender to palpation, no masses, no rebound or guarding  GU/BREAST: Declined, does with GYN  SKIN: no rash or abnormal lesions, refused a full skin exam  MS: normal gait, moves all extremities normally  NEURO: normal gait, speech and thought processing grossly intact, muscle tone grossly intact throughout  PSYCH: normal affect, pleasant and cooperative  ASSESSMENT AND PLAN:  Discussed the following assessment and plan:  PREVENTIVE EXAM: -Discussed and advised all [place] preventive services health task force level A and B recommendations for age, sex and risks. -Advised at least 150 minutes of exercise per week and a healthy diet with avoidance of (less then 1 serving per week) processed foods, white starches, red meat, fast foods and sweets and consisting of: * 5-9 servings of fresh fruits and vegetables (not corn or potatoes) *nuts and seeds, beans *olives and olive oil *lean meats such as fish and white chicken  *whole grains -labs, studies and vaccines per orders this encounter  2.  Screening for depression -neg  3. Hyperlipidemia, unspecified hyperlipidemia type -lifestyle recs, cont statin - refills sent - Lipid panel  4. Class 2 severe obesity with serious comorbidity and body mass index (BMI) of 39.0 to 39.9 in adult, unspecified obesity type (HCC) -lifestyle recs - Hemoglobin A1c  5. Hypothyroidism, unspecified type -sees endo for this and hirtusism - Basic metabolic panel  6. Recurrent major depressive disorder, in full remission ([place]) -stable, cont zoloft  Refused flu vaccine. Patient advised to return to clinic immediately if symptoms worsen or persist or new concerns.  Patient Instructions  BEFORE YOU LEAVE: -labs -follow up: 4-6 months  We have ordered labs or studies at this visit. It can take up to 1-2 weeks for results and processing. IF results require follow up or explanation, we will call you with instructions. Clinically stable results will be released to your [facility]. If you have not heard from [place] or cannot find your results in  MYCHART in 2 weeks please contact our office at [phone].  If you are not yet signed up for [facility], please consider signing up.   We recommend the following healthy lifestyle for LIFE: 1) Small portions. But, make sure to get regular (at least 3 per day), healthy meals and small healthy snacks if needed.  2) Eat a healthy clean diet.   TRY TO EAT: -at least 5-7 servings of low sugar, colorful, and nutrient rich vegetables per day (not corn, potatoes or bananas.) -berries are the best choice if you wish to eat fruit (only eat small amounts if trying to reduce weight)  -lean meets (fish, white meat of chicken or [place]) -vegan proteins for some meals - beans or tofu, whole grains, nuts and seeds -Replace bad fats with good fats - good fats include: fish, nuts and seeds, canola oil, olive oil -small amounts of low fat or non fat dairy -small amounts of100 % whole grains - check the lables -drink plenty of  water  AVOID: -SUGAR, sweets, anything with added sugar, corn syrup or sweeteners - must read labels as even foods advertised as "healthy" often are loaded with sugar -if you must have a sweetener, small amounts of stevia may be best -sweetened beverages and artificially sweetened beverages -simple starches (rice, bread, potatoes, pasta, chips, etc - small amounts of 100% whole grains are ok) -red meat, pork, butter -fried foods, fast food, processed food, excessive dairy, eggs and coconut.  3)Get at least 150 minutes of sweaty aerobic exercise per week.  4)Reduce stress - consider counseling, meditation and relaxation to balance other aspects of your life.   Preventive Care 40-64 Years, Female Preventive care refers to lifestyle choices and visits with your health care provider that can promote health and wellness. What does preventive care include?  A yearly physical exam. This is also called an annual well check.  Dental exams once or twice a year.  Routine eye exams. Ask your health care provider how often you should have your eyes checked.  Personal lifestyle choices, including: ? Daily care of your teeth and gums. ? Regular physical activity. ? Eating a healthy diet. ? Avoiding tobacco and drug use. ? Limiting alcohol use. ? Practicing safe sex. ? Taking low-dose aspirin daily starting at age 48. ? Taking vitamin and mineral supplements as recommended by your health care provider. What happens during an annual well check? The services and screenings done by your health care provider during your annual well check will depend on your age, overall health, lifestyle risk factors, and family history of disease. Counseling Your health care provider may ask you questions about your:  Alcohol use.  Tobacco use.  Drug use.  Emotional well-being.  Home and relationship well-being.  Sexual activity.  Eating habits.  Work and work Statistician.  Method of birth  control.  Menstrual cycle.  Pregnancy history.  Screening You may have the following tests or measurements:  Height, weight, and BMI.  Blood pressure.  Lipid and cholesterol levels. These may be checked every 5 years, or more frequently if you are over 16 years old.  Skin check.  Lung cancer screening. You may have this screening every year starting at age 39 if you have a 30-pack-year history of smoking and currently smoke or have quit within the past 15 years.  Fecal occult blood test (FOBT) of the stool. You may have this test every year starting at age 32.  Flexible sigmoidoscopy or colonoscopy. You may have a  sigmoidoscopy every 5 years or a colonoscopy every 10 years starting at age 5.  Hepatitis C blood test.  Hepatitis B blood test.  Sexually transmitted disease (STD) testing.  Diabetes screening. This is done by checking your blood sugar (glucose) after you have not eaten for a while (fasting). You may have this done every 1-3 years.  Mammogram. This may be done every 1-2 years. Talk to your health care provider about when you should start having regular mammograms. This may depend on whether you have a family history of breast cancer.  BRCA-related cancer screening. This may be done if you have a family history of breast, ovarian, tubal, or peritoneal cancers.  Pelvic exam and Pap test. This may be done every 3 years starting at age 52. Starting at age 14, this may be done every 5 years if you have a Pap test in combination with an HPV test.  Bone density scan. This is done to screen for osteoporosis. You may have this scan if you are at high risk for osteoporosis.  Discuss your test results, treatment options, and if necessary, the need for more tests with your health care provider. Vaccines Your health care provider may recommend certain vaccines, such as:  Influenza vaccine. This is recommended every year.  Tetanus, diphtheria, and acellular pertussis (Tdap,  Td) vaccine. You may need a Td booster every 10 years.  Varicella vaccine. You may need this if you have not been vaccinated.  Zoster vaccine. You may need this after age 29.  Measles, mumps, and rubella (MMR) vaccine. You may need at least one dose of MMR if you were born in 1957 or later. You may also need a second dose.  Pneumococcal 13-valent conjugate (PCV13) vaccine. You may need this if you have certain conditions and were not previously vaccinated.  Pneumococcal polysaccharide (PPSV23) vaccine. You may need one or two doses if you smoke cigarettes or if you have certain conditions.  Meningococcal vaccine. You may need this if you have certain conditions.  Hepatitis A vaccine. You may need this if you have certain conditions or if you travel or work in places where you may be exposed to hepatitis A.  Hepatitis B vaccine. You may need this if you have certain conditions or if you travel or work in places where you may be exposed to hepatitis B.  Haemophilus influenzae type b (Hib) vaccine. You may need this if you have certain conditions.  Talk to your health care provider about which screenings and vaccines you need and how often you need them. This information is not intended to replace advice given to you by your health care provider. Make sure you discuss any questions you have with your health care provider. Document Released: 06/10/2015 Document Revised: 02/01/2016 Document Reviewed: 03/15/2015 Elsevier Interactive Patient Education  2018 [employer].         No Follow-up on file.  Lucretia Kern, DO

## 2017-06-17 ENCOUNTER — Encounter: Payer: Self-pay | Admitting: Family Medicine

## 2017-06-17 ENCOUNTER — Ambulatory Visit (INDEPENDENT_AMBULATORY_CARE_PROVIDER_SITE_OTHER): Payer: BLUE CROSS/BLUE SHIELD | Admitting: Family Medicine

## 2017-06-17 VITALS — BP 110/80 | HR 85 | Temp 98.6°F | Ht 64.0 in | Wt 229.6 lb

## 2017-06-17 DIAGNOSIS — E039 Hypothyroidism, unspecified: Secondary | ICD-10-CM | POA: Diagnosis not present

## 2017-06-17 DIAGNOSIS — E785 Hyperlipidemia, unspecified: Secondary | ICD-10-CM | POA: Diagnosis not present

## 2017-06-17 DIAGNOSIS — F3342 Major depressive disorder, recurrent, in full remission: Secondary | ICD-10-CM

## 2017-06-17 DIAGNOSIS — Z1331 Encounter for screening for depression: Secondary | ICD-10-CM

## 2017-06-17 DIAGNOSIS — Z6839 Body mass index (BMI) 39.0-39.9, adult: Secondary | ICD-10-CM | POA: Diagnosis not present

## 2017-06-17 DIAGNOSIS — Z0000 Encounter for general adult medical examination without abnormal findings: Secondary | ICD-10-CM

## 2017-06-17 LAB — LIPID PANEL
Cholesterol: 173 mg/dL (ref 0–200)
HDL: 53 mg/dL (ref >39.00)
LDL Cholesterol: 99 mg/dL (ref 0–99)
NonHDL: 120.33
Total CHOL/HDL Ratio: 3
Triglycerides: 106 mg/dL (ref 0.0–149.0)
VLDL: 21.2 mg/dL (ref 0.0–40.0)

## 2017-06-17 LAB — BASIC METABOLIC PANEL
BUN: 16 mg/dL (ref 6–23)
CO2: 25 mEq/L (ref 19–32)
Calcium: 9.1 mg/dL (ref 8.4–10.5)
Chloride: 103 mEq/L (ref 96–112)
Creatinine, Ser: 0.75 mg/dL (ref 0.40–1.20)
GFR: 87.97 mL/min (ref >60.00)
Glucose, Bld: 105 mg/dL — ABNORMAL HIGH (ref 70–99)
Potassium: 4.2 mEq/L (ref 3.5–5.1)
Sodium: 136 mEq/L (ref 135–145)

## 2017-06-17 LAB — HEMOGLOBIN A1C: Hgb A1c MFr Bld: 5.9 % (ref 4.6–6.5)

## 2017-06-17 MED ORDER — ATORVASTATIN CALCIUM 20 MG PO TABS: 20.0000 mg | ORAL_TABLET | Freq: Every day | ORAL | 3 refills | Status: DC

## 2017-06-17 NOTE — Patient Instructions (Signed)
BEFORE YOU LEAVE: -labs -follow up: 4-6 months  We have ordered labs or studies at this visit. It can take up to 1-2 weeks for results and processing. IF results require follow up or explanation, we will call you with instructions. Clinically stable results will be released to your [facility]. If you have not heard from [place] or cannot find your results in [facility] in 2 weeks please contact our office at [phone].  If you are not yet signed up for [facility], please consider signing up.   We recommend the following healthy lifestyle for LIFE: 1) Small portions. But, make sure to get regular (at least 3 per day), healthy meals and small healthy snacks if needed.  2) Eat a healthy clean diet.   TRY TO EAT: -at least 5-7 servings of low sugar, colorful, and nutrient rich vegetables per day (not corn, potatoes or bananas.) -berries are the best choice if you wish to eat fruit (only eat small amounts if trying to reduce weight)  -lean meets (fish, white meat of chicken or [place]) -vegan proteins for some meals - beans or tofu, whole grains, nuts and seeds -Replace bad fats with good fats - good fats include: fish, nuts and seeds, canola oil, olive oil -small amounts of low fat or non fat dairy -small amounts of100 % whole grains - check the lables -drink plenty of water  AVOID: -SUGAR, sweets, anything with added sugar, corn syrup or sweeteners - must read labels as even foods advertised as "healthy" often are loaded with sugar -if you must have a sweetener, small amounts of stevia may be best -sweetened beverages and artificially sweetened beverages -simple starches (rice, bread, potatoes, pasta, chips, etc - small amounts of 100% whole grains are ok) -red meat, pork, butter -fried foods, fast food, processed food, excessive dairy, eggs and coconut.  3)Get at least 150 minutes of sweaty aerobic exercise per week.  4)Reduce stress - consider counseling, meditation and relaxation to balance  other aspects of your life.   Preventive Care 40-64 Years, Female Preventive care refers to lifestyle choices and visits with your health care provider that can promote health and wellness. What does preventive care include?  A yearly physical exam. This is also called an annual well check.  Dental exams once or twice a year.  Routine eye exams. Ask your health care provider how often you should have your eyes checked.  Personal lifestyle choices, including: ? Daily care of your teeth and gums. ? Regular physical activity. ? Eating a healthy diet. ? Avoiding tobacco and drug use. ? Limiting alcohol use. ? Practicing safe sex. ? Taking low-dose aspirin daily starting at age 41. ? Taking vitamin and mineral supplements as recommended by your health care provider. What happens during an annual well check? The services and screenings done by your health care provider during your annual well check will depend on your age, overall health, lifestyle risk factors, and family history of disease. Counseling Your health care provider may ask you questions about your:  Alcohol use.  Tobacco use.  Drug use.  Emotional well-being.  Home and relationship well-being.  Sexual activity.  Eating habits.  Work and work Statistician.  Method of birth control.  Menstrual cycle.  Pregnancy history.  Screening You may have the following tests or measurements:  Height, weight, and BMI.  Blood pressure.  Lipid and cholesterol levels. These may be checked every 5 years, or more frequently if you are over 43 years old.  Skin check.  Lung cancer screening. You may have this screening every year starting at age 69 if you have a 30-pack-year history of smoking and currently smoke or have quit within the past 15 years.  Fecal occult blood test (FOBT) of the stool. You may have this test every year starting at age 20.  Flexible sigmoidoscopy or colonoscopy. You may have a sigmoidoscopy  every 5 years or a colonoscopy every 10 years starting at age 18.  Hepatitis C blood test.  Hepatitis B blood test.  Sexually transmitted disease (STD) testing.  Diabetes screening. This is done by checking your blood sugar (glucose) after you have not eaten for a while (fasting). You may have this done every 1-3 years.  Mammogram. This may be done every 1-2 years. Talk to your health care provider about when you should start having regular mammograms. This may depend on whether you have a family history of breast cancer.  BRCA-related cancer screening. This may be done if you have a family history of breast, ovarian, tubal, or peritoneal cancers.  Pelvic exam and Pap test. This may be done every 3 years starting at age 17. Starting at age 95, this may be done every 5 years if you have a Pap test in combination with an HPV test.  Bone density scan. This is done to screen for osteoporosis. You may have this scan if you are at high risk for osteoporosis.  Discuss your test results, treatment options, and if necessary, the need for more tests with your health care provider. Vaccines Your health care provider may recommend certain vaccines, such as:  Influenza vaccine. This is recommended every year.  Tetanus, diphtheria, and acellular pertussis (Tdap, Td) vaccine. You may need a Td booster every 10 years.  Varicella vaccine. You may need this if you have not been vaccinated.  Zoster vaccine. You may need this after age 15.  Measles, mumps, and rubella (MMR) vaccine. You may need at least one dose of MMR if you were born in 1957 or later. You may also need a second dose.  Pneumococcal 13-valent conjugate (PCV13) vaccine. You may need this if you have certain conditions and were not previously vaccinated.  Pneumococcal polysaccharide (PPSV23) vaccine. You may need one or two doses if you smoke cigarettes or if you have certain conditions.  Meningococcal vaccine. You may need this if  you have certain conditions.  Hepatitis A vaccine. You may need this if you have certain conditions or if you travel or work in places where you may be exposed to hepatitis A.  Hepatitis B vaccine. You may need this if you have certain conditions or if you travel or work in places where you may be exposed to hepatitis B.  Haemophilus influenzae type b (Hib) vaccine. You may need this if you have certain conditions.  Talk to your health care provider about which screenings and vaccines you need and how often you need them. This information is not intended to replace advice given to you by your health care provider. Make sure you discuss any questions you have with your health care provider. Document Released: 06/10/2015 Document Revised: 02/01/2016 Document Reviewed: 03/15/2015 Elsevier Interactive Patient Education  [employer].

## 2017-06-26 ENCOUNTER — Encounter: Payer: Self-pay | Admitting: Family Medicine

## 2017-06-26 MED ORDER — SERTRALINE HCL 100 MG PO TABS: ORAL_TABLET | ORAL | 1 refills | Status: DC

## 2017-07-02 ENCOUNTER — Other Ambulatory Visit: Payer: Self-pay | Admitting: *Deleted

## 2017-07-02 MED ORDER — ATORVASTATIN CALCIUM 20 MG PO TABS: 20.0000 mg | ORAL_TABLET | Freq: Every day | ORAL | 3 refills | Status: DC

## 2017-07-02 NOTE — Telephone Encounter (Signed)
Rx done. 

## 2017-07-12 DIAGNOSIS — H524 Presbyopia: Secondary | ICD-10-CM | POA: Diagnosis not present

## 2017-07-12 DIAGNOSIS — H01009 Unspecified blepharitis unspecified eye, unspecified eyelid: Secondary | ICD-10-CM | POA: Diagnosis not present

## 2017-09-23 DIAGNOSIS — E039 Hypothyroidism, unspecified: Secondary | ICD-10-CM | POA: Diagnosis not present

## 2017-09-30 DIAGNOSIS — L680 Hirsutism: Secondary | ICD-10-CM | POA: Diagnosis not present

## 2017-09-30 DIAGNOSIS — E039 Hypothyroidism, unspecified: Secondary | ICD-10-CM | POA: Diagnosis not present

## 2017-10-10 DIAGNOSIS — J019 Acute sinusitis, unspecified: Secondary | ICD-10-CM | POA: Diagnosis not present

## 2017-11-16 ENCOUNTER — Other Ambulatory Visit: Payer: Self-pay | Admitting: Family Medicine

## 2017-12-02 DIAGNOSIS — E039 Hypothyroidism, unspecified: Secondary | ICD-10-CM | POA: Diagnosis not present

## 2017-12-16 ENCOUNTER — Ambulatory Visit: Payer: BLUE CROSS/BLUE SHIELD | Admitting: Family Medicine

## 2017-12-30 ENCOUNTER — Other Ambulatory Visit: Payer: Self-pay | Admitting: Family Medicine

## 2017-12-30 DIAGNOSIS — Z1231 Encounter for screening mammogram for malignant neoplasm of breast: Secondary | ICD-10-CM

## 2018-02-14 ENCOUNTER — Ambulatory Visit
Admission: RE | Admit: 2018-02-14 | Discharge: 2018-02-14 | Disposition: A | Discharge status: Home / Self Care | Payer: BLUE CROSS/BLUE SHIELD | Source: Ambulatory Visit | Attending: Family Medicine | Admitting: Family Medicine

## 2018-02-14 DIAGNOSIS — Z1231 Encounter for screening mammogram for malignant neoplasm of breast: Secondary | ICD-10-CM | POA: Diagnosis not present

## 2018-05-30 ENCOUNTER — Other Ambulatory Visit: Payer: Self-pay | Admitting: Family Medicine

## 2018-06-19 ENCOUNTER — Encounter: Payer: BLUE CROSS/BLUE SHIELD | Admitting: Family Medicine

## 2018-07-06 NOTE — Progress Notes (Signed)
HPI:  Using dictation device. Unfortunately this device frequently misinterprets words/phrases.  Here for CPE: Due for labs, depression screening -Concerns and/or follow up today:   Chronic medical problems summarized below were reviewed for changes.  Reports is doing great.  Mood is good.  No complaints.  Reports she sees her endocrinologist for her thyroid and kidney labs.  Declines these here today.  She declines a skin exam and gynecological exam today. Reports she sees her gynecologist yearly and does her mammogram and also her Pap smears when due. No regular exercise.  Reports she knows what she needs to do with her diet, just needs to do it.  Hyperlipidemia/hyperglycemia/morbid obesity -BMI greater than 35+ hyperlipidemia and hyperglycemia: -for 5-10 years -on atorvastatin  -no regular exercise and diet is not great  Depression and Anxiety: -taking zoloft 140m daily -no SI or hx of  Seasonal allergies: -takes allergy medications  Hypothyroidism/Hirsutism -sees an endocrinologist, Dr. BChalmers Cater-taking spironolactone, sythroid  -Diabetes and Dyslipidemia Screening: Due for labs, fasting -Vaccines: see vaccine section EPIC, she declines her flu vaccine -pap history: Sees gynecologist for this, Dr. RMarvel Plan-FDLMP: see nursing notes -sexual activity: Sees GYN -wants STI testing (Hep C if born [birth year]: no -FH breast, colon or ovarian ca: see FH Last mammogram: Sees Dr. RMarvel Planfor this, GYN Last colon cancer screening: Not applicable Breast Ca Risk Assessment: see family history and pt history DEXA (>/= 65): Not applicable  -Alcohol, Tobacco, drug use: see social history  Review of Systems - no fevers, unintentional weight loss, vision loss, hearing loss, chest pain, sob, hemoptysis, melena, hematochezia, hematuria, genital discharge, changing or concerning skin lesions, bleeding, bruising, loc, thoughts of self harm or SI  Past Medical History:  Diagnosis Date   . Depression   . Hirsutism   . Hyperlipidemia   . Hypothyroidism 04/27/2016   -Seeing Dr. BChalmers Caterendocrinology -Diagnosed in 2017 with her endocrinologist   . Seasonal allergies     Past Surgical History:  Procedure Laterality Date  . NASAL SINUS SURGERY     may 2015, Dr. SWilburn Cornelia   Family History  Problem Relation Age of Onset  . Alcohol abuse Mother   . Cirrhosis Mother   . Heart disease Father   . Mental illness Father        dementia    Social History   Socioeconomic History  . Marital status: Single    Spouse name: Not on file  . Number of children: Not on file  . Years of education: Not on file  . Highest education level: Not on file  Occupational History  . Not on file  Social Needs  . Financial resource strain: Not on file  . Food insecurity:    Worry: Not on file    Inability: Not on file  . Transportation needs:    Medical: Not on file    Non-medical: Not on file  Tobacco Use  . Smoking status: Never Smoker  . Smokeless tobacco: Never Used  Substance and Sexual Activity  . Alcohol use: No  . Drug use: No  . Sexual activity: Not on file  Lifestyle  . Physical activity:    Days per week: Not on file    Minutes per session: Not on file  . Stress: Not on file  Relationships  . Social connections:    Talks on phone: Not on file    Gets together: Not on file    Attends religious service: Not on file  Active member of club or organization: Not on file    Attends meetings of clubs or organizations: Not on file    Relationship status: Not on file  Other Topics Concern  . 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  Ascorbic Acid (VITAMIN C PO), Take by mouth., Disp: , Rfl:  .  atorvastatin (LIPITOR) 20 MG  tablet, TAKE 1 TABLET DAILY, Disp: 90 tablet, Rfl: 1 .  levocetirizine (XYZAL) 5 MG tablet, TAKE 1 TABLET EVERY EVENING, Disp: 90 tablet, Rfl: 0 .  mometasone (NASONEX) 50 MCG/ACT nasal spray, Place 2 sprays into the nose daily., Disp: 17 g, Rfl: 4 .  Multiple Vitamin (MULTIVITAMINS PO), Take by mouth., Disp: , Rfl:  .  sertraline (ZOLOFT) 100 MG tablet, TAKE 1 TABLET DAILY, Disp: 90 tablet, Rfl: 1 .  spironolactone (ALDACTONE) 25 MG tablet, Take 25 mg by mouth daily.  , Disp: , Rfl:  .  SYNTHROID 75 MCG tablet, , Disp: , Rfl:   EXAM:  Vitals:   07/07/18 1012  BP: 112/76  Pulse: 84  Temp: 98 F (36.7 C)    GENERAL: vitals reviewed and listed below, alert, oriented, appears well hydrated and in no acute distress  HEENT: head atraumatic, PERRLA, normal appearance of eyes, ears, nose and mouth. moist mucus membranes.  NECK: supple, no masses or lymphadenopathy  LUNGS: clear to auscultation bilaterally, no rales, rhonchi or wheeze  CV: HRRR, no peripheral edema or cyanosis, normal pedal pulses  ABDOMEN: bowel sounds normal, soft, non tender to palpation, no masses, no rebound or guarding  GU/BREAST: Declined, does with GYN  SKIN: no rash or abnormal lesions, declined full skin exam  MS: normal gait, moves all extremities normally  NEURO: normal gait, speech and thought processing grossly intact, muscle tone grossly intact throughout  PSYCH: normal affect, pleasant and cooperative  ASSESSMENT AND PLAN:  Discussed the following assessment and plan:  PREVENTIVE EXAM: -Discussed and advised all [place] preventive services health task force level A and B recommendations for age, sex and risks. -Advised at least 150 minutes of exercise per week and a healthy diet with avoidance of (less then 1 serving per week) processed foods, white starches, red meat, fast foods and sweets and consisting of: * 5-9 servings of fresh fruits and vegetables (not corn or potatoes) *nuts and seeds,  beans *olives and olive oil *lean meats such as fish and white chicken  *whole grains -labs, studies and vaccines per orders this encounter  1. Hypothyroidism, unspecified type -Seeing endocrine for management, declined check here  2. Hyperlipidemia, unspecified hyperlipidemia type - Lipid panel, continue current medication -stable -lifestyle recs  3. Recurrent major depressive disorder, in full remission ([place]) -Stable, well controlled, see PHQ 9 -continue SSRI  4. Hyperglycemia - Hemoglobin A1c -lifestyle recs  5. Morbid obesity ([place]) -Lifestyle recommendations     Patient advised to return to clinic immediately if symptoms worsen or persist or new concerns.  Patient Instructions  BEFORE YOU LEAVE: -Labs -follow up: 6 months, annually if everything is stable  We have ordered labs or studies at this visit. It can take up to 1-2 weeks for results and processing. IF results require follow  up or explanation, we will call you with instructions. Clinically stable results will be released to your [facility]. If you have not heard from [place] or cannot find your results in [facility] in 2 weeks please contact our office at [phone].  If you are not yet signed up for [facility], please consider signing up.   Preventive Care 40-64 Years, Female Preventive care refers to lifestyle choices and visits with your health care provider that can promote health and wellness. What does preventive care include?   A yearly physical exam. This is also called an annual well check.  Dental exams once or twice a year.  Routine eye exams. Ask your health care provider how often you should have your eyes checked.  Personal lifestyle choices, including: ? Daily care of your teeth and gums. ? Regular physical activity. ? Eating a healthy diet. ? Avoiding tobacco and drug use. ? Limiting alcohol use. ? Practicing safe sex. ? Taking vitamin and mineral supplements as recommended by your health care  provider. What happens during an annual well check? The services and screenings done by your health care provider during your annual well check will depend on your age, overall health, lifestyle risk factors, and family history of disease. Counseling Your health care provider may ask you questions about your:  Alcohol use.  Tobacco use.  Drug use.  Emotional well-being.  Home and relationship well-being.  Sexual activity.  Eating habits.  Work and work Statistician.  Method of birth control.  Menstrual cycle.  Pregnancy history. Screening You may have the following tests or measurements:  Height, weight, and BMI.  Blood pressure.  Lipid and cholesterol levels. These may be checked every 5 years, or more frequently if you are over 22 years old.  Skin check.  Lung cancer screening. You may have this screening every year starting at age 84 if you have a 30-pack-year history of smoking and currently smoke or have quit within the past 15 years.  Colorectal cancer screening. All adults should have this screening starting at age 71 and continuing until age 38. Your health care provider may recommend screening at age 31. You will have tests every 1-10 years, depending on your results and the type of screening test. People at increased risk should start screening at an earlier age. Screening tests may include: ? Guaiac-based fecal occult blood testing. ? Fecal immunochemical test (FIT). ? Stool DNA test. ? Virtual colonoscopy. ? Sigmoidoscopy. During this test, a flexible tube with a tiny camera (sigmoidoscope) is used to examine your rectum and lower colon. The sigmoidoscope is inserted through your anus into your rectum and lower colon. ? Colonoscopy. During this test, a long, thin, flexible tube with a tiny camera (colonoscope) is used to examine your entire colon and rectum.  Hepatitis C blood test.  Hepatitis B blood test.  Sexually transmitted disease (STD)  testing.  Diabetes screening. This is done by checking your blood sugar (glucose) after you have not eaten for a while (fasting). You may have this done every 1-3 years.  Mammogram. This may be done every 1-2 years. Talk to your health care provider about when you should start having regular mammograms. This may depend on whether you have a family history of breast cancer.  BRCA-related cancer screening. This may be done if you have a family history of breast, ovarian, tubal, or peritoneal cancers.  Pelvic exam and Pap test. This may be done every 3 years starting at age 38. Starting at age 79,  this may be done every 5 years if you have a Pap test in combination with an HPV test.  Bone density scan. This is done to screen for osteoporosis. You may have this scan if you are at high risk for osteoporosis. Discuss your test results, treatment options, and if necessary, the need for more tests with your health care provider. Vaccines Your health care provider may recommend certain vaccines, such as:  Influenza vaccine. This is recommended every year.  Tetanus, diphtheria, and acellular pertussis (Tdap, Td) vaccine. You may need a Td booster every 10 years.  Varicella vaccine. You may need this if you have not been vaccinated.  Zoster vaccine. You may need this after age 59.  Measles, mumps, and rubella (MMR) vaccine. You may need at least one dose of MMR if you were born in 1957 or later. You may also need a second dose.  Pneumococcal 13-valent conjugate (PCV13) vaccine. You may need this if you have certain conditions and were not previously vaccinated.  Pneumococcal polysaccharide (PPSV23) vaccine. You may need one or two doses if you smoke cigarettes or if you have certain conditions.  Meningococcal vaccine. You may need this if you have certain conditions.  Hepatitis A vaccine. You may need this if you have certain conditions or if you travel or work in places where you may be exposed  to hepatitis A.  Hepatitis B vaccine. You may need this if you have certain conditions or if you travel or work in places where you may be exposed to hepatitis B.  Haemophilus influenzae type b (Hib) vaccine. You may need this if you have certain conditions. Talk to your health care provider about which screenings and vaccines you need and how often you need them. This information is not intended to replace advice given to you by your health care provider. Make sure you discuss any questions you have with your health care provider. Document Released: 06/10/2015 Document Revised: 07/04/2017 Document Reviewed: 03/15/2015 Elsevier Interactive Patient Education  2019 [employer].          No follow-ups on file.  Lucretia Kern, DO

## 2018-07-07 ENCOUNTER — Encounter: Payer: Self-pay | Admitting: Family Medicine

## 2018-07-07 ENCOUNTER — Ambulatory Visit (INDEPENDENT_AMBULATORY_CARE_PROVIDER_SITE_OTHER): Payer: BLUE CROSS/BLUE SHIELD | Admitting: Family Medicine

## 2018-07-07 VITALS — BP 112/76 | HR 84 | Temp 98.0°F | Ht 63.0 in | Wt 214.4 lb

## 2018-07-07 DIAGNOSIS — F3342 Major depressive disorder, recurrent, in full remission: Secondary | ICD-10-CM

## 2018-07-07 DIAGNOSIS — R739 Hyperglycemia, unspecified: Secondary | ICD-10-CM | POA: Diagnosis not present

## 2018-07-07 DIAGNOSIS — Z0000 Encounter for general adult medical examination without abnormal findings: Secondary | ICD-10-CM

## 2018-07-07 DIAGNOSIS — E785 Hyperlipidemia, unspecified: Secondary | ICD-10-CM | POA: Diagnosis not present

## 2018-07-07 DIAGNOSIS — E039 Hypothyroidism, unspecified: Secondary | ICD-10-CM | POA: Diagnosis not present

## 2018-07-07 LAB — LIPID PANEL
Cholesterol: 205 mg/dL — ABNORMAL HIGH (ref 0–200)
HDL: 48 mg/dL (ref >39.00)
LDL Cholesterol: 130 mg/dL — ABNORMAL HIGH (ref 0–99)
NonHDL: 156.75
Total CHOL/HDL Ratio: 4
Triglycerides: 136 mg/dL (ref 0.0–149.0)
VLDL: 27.2 mg/dL (ref 0.0–40.0)

## 2018-07-07 LAB — HEMOGLOBIN A1C: Hgb A1c MFr Bld: 5.7 % (ref 4.6–6.5)

## 2018-07-07 NOTE — Patient Instructions (Signed)
BEFORE YOU LEAVE: -Labs -follow up: 6 months, annually if everything is stable  We have ordered labs or studies at this visit. It can take up to 1-2 weeks for results and processing. IF results require follow up or explanation, we will call you with instructions. Clinically stable results will be released to your [facility]. If you have not heard from [place] or cannot find your results in [facility] in 2 weeks please contact our office at [phone].  If you are not yet signed up for [facility], please consider signing up.   Preventive Care 40-64 Years, Female Preventive care refers to lifestyle choices and visits with your health care provider that can promote health and wellness. What does preventive care include?   A yearly physical exam. This is also called an annual well check.  Dental exams once or twice a year.  Routine eye exams. Ask your health care provider how often you should have your eyes checked.  Personal lifestyle choices, including: ? Daily care of your teeth and gums. ? Regular physical activity. ? Eating a healthy diet. ? Avoiding tobacco and drug use. ? Limiting alcohol use. ? Practicing safe sex. ? Taking vitamin and mineral supplements as recommended by your health care provider. What happens during an annual well check? The services and screenings done by your health care provider during your annual well check will depend on your age, overall health, lifestyle risk factors, and family history of disease. Counseling Your health care provider may ask you questions about your:  Alcohol use.  Tobacco use.  Drug use.  Emotional well-being.  Home and relationship well-being.  Sexual activity.  Eating habits.  Work and work Statistician.  Method of birth control.  Menstrual cycle.  Pregnancy history. Screening You may have the following tests or measurements:  Height, weight, and BMI.  Blood pressure.  Lipid and cholesterol levels. These may be checked  every 5 years, or more frequently if you are over 88 years old.  Skin check.  Lung cancer screening. You may have this screening every year starting at age 43 if you have a 30-pack-year history of smoking and currently smoke or have quit within the past 15 years.  Colorectal cancer screening. All adults should have this screening starting at age 79 and continuing until age 34. Your health care provider may recommend screening at age 42. You will have tests every 1-10 years, depending on your results and the type of screening test. People at increased risk should start screening at an earlier age. Screening tests may include: ? Guaiac-based fecal occult blood testing. ? Fecal immunochemical test (FIT). ? Stool DNA test. ? Virtual colonoscopy. ? Sigmoidoscopy. During this test, a flexible tube with a tiny camera (sigmoidoscope) is used to examine your rectum and lower colon. The sigmoidoscope is inserted through your anus into your rectum and lower colon. ? Colonoscopy. During this test, a long, thin, flexible tube with a tiny camera (colonoscope) is used to examine your entire colon and rectum.  Hepatitis C blood test.  Hepatitis B blood test.  Sexually transmitted disease (STD) testing.  Diabetes screening. This is done by checking your blood sugar (glucose) after you have not eaten for a while (fasting). You may have this done every 1-3 years.  Mammogram. This may be done every 1-2 years. Talk to your health care provider about when you should start having regular mammograms. This may depend on whether you have a family history of breast cancer.  BRCA-related cancer screening. This  may be done if you have a family history of breast, ovarian, tubal, or peritoneal cancers.  Pelvic exam and Pap test. This may be done every 3 years starting at age 50. Starting at age 78, this may be done every 5 years if you have a Pap test in combination with an HPV test.  Bone density scan. This is done to  screen for osteoporosis. You may have this scan if you are at high risk for osteoporosis. Discuss your test results, treatment options, and if necessary, the need for more tests with your health care provider. Vaccines Your health care provider may recommend certain vaccines, such as:  Influenza vaccine. This is recommended every year.  Tetanus, diphtheria, and acellular pertussis (Tdap, Td) vaccine. You may need a Td booster every 10 years.  Varicella vaccine. You may need this if you have not been vaccinated.  Zoster vaccine. You may need this after age 50.  Measles, mumps, and rubella (MMR) vaccine. You may need at least one dose of MMR if you were born in 1957 or later. You may also need a second dose.  Pneumococcal 13-valent conjugate (PCV13) vaccine. You may need this if you have certain conditions and were not previously vaccinated.  Pneumococcal polysaccharide (PPSV23) vaccine. You may need one or two doses if you smoke cigarettes or if you have certain conditions.  Meningococcal vaccine. You may need this if you have certain conditions.  Hepatitis A vaccine. You may need this if you have certain conditions or if you travel or work in places where you may be exposed to hepatitis A.  Hepatitis B vaccine. You may need this if you have certain conditions or if you travel or work in places where you may be exposed to hepatitis B.  Haemophilus influenzae type b (Hib) vaccine. You may need this if you have certain conditions. Talk to your health care provider about which screenings and vaccines you need and how often you need them. This information is not intended to replace advice given to you by your health care provider. Make sure you discuss any questions you have with your health care provider. Document Released: 06/10/2015 Document Revised: 07/04/2017 Document Reviewed: 03/15/2015 Elsevier Interactive Patient Education  2019 [employer].

## 2018-07-08 ENCOUNTER — Telehealth: Payer: Self-pay | Admitting: Family Medicine

## 2018-07-08 NOTE — Telephone Encounter (Signed)
Telephone call from  Patient requesting lab  Results.    Provided results of  [name] , DO  On  07/08/18  Patient  Voices  Understanding.

## 2018-07-08 NOTE — Telephone Encounter (Signed)
Copied from CRM [id number]. Topic: Quick Communication - Lab Results (Clinic Use ONLY) >> Jul 08, 2018  2:05 PM Johnella Moloney, CMA wrote: Called patient to inform them of lab results. When patient returns call, triage nurse may disclose results. >> Jul 08, 2018  2:16 PM Arlyss Gandy, NT wrote: Pt calling back for lab results.

## 2018-08-01 DIAGNOSIS — H35433 Paving stone degeneration of retina, bilateral: Secondary | ICD-10-CM | POA: Diagnosis not present

## 2018-08-01 DIAGNOSIS — H524 Presbyopia: Secondary | ICD-10-CM | POA: Diagnosis not present

## 2018-08-01 DIAGNOSIS — H01009 Unspecified blepharitis unspecified eye, unspecified eyelid: Secondary | ICD-10-CM | POA: Diagnosis not present

## 2018-08-01 DIAGNOSIS — H35361 Drusen (degenerative) of macula, right eye: Secondary | ICD-10-CM | POA: Diagnosis not present

## 2018-09-01 ENCOUNTER — Telehealth: Payer: Self-pay

## 2018-09-01 NOTE — Telephone Encounter (Signed)
Appointment has been changed. The patient is aware. No refills needed at this time.

## 2018-09-01 NOTE — Telephone Encounter (Signed)
Copied from CRM [id number]. Topic: General - Other >> Aug 28, 2018  2:49 PM Herby Abraham C wrote: Reason for CRM: pt received letter from Selena Batten to [facility] pt is TOC to [place]. Pt already had an ov scheduled for August for 6 mth f/u, pt would like to keep and make that apt her TOC. Pt would like to know if this is okay and if she would be okay with her medications refills until?     Please advise.

## 2018-09-25 DIAGNOSIS — E039 Hypothyroidism, unspecified: Secondary | ICD-10-CM | POA: Diagnosis not present

## 2018-09-25 DIAGNOSIS — L680 Hirsutism: Secondary | ICD-10-CM | POA: Diagnosis not present

## 2018-10-02 DIAGNOSIS — L680 Hirsutism: Secondary | ICD-10-CM | POA: Diagnosis not present

## 2018-10-02 DIAGNOSIS — E039 Hypothyroidism, unspecified: Secondary | ICD-10-CM | POA: Diagnosis not present

## 2018-11-10 ENCOUNTER — Other Ambulatory Visit: Payer: Self-pay | Admitting: Family Medicine

## 2019-01-02 ENCOUNTER — Other Ambulatory Visit: Payer: Self-pay | Admitting: Family Medicine

## 2019-01-02 DIAGNOSIS — Z1231 Encounter for screening mammogram for malignant neoplasm of breast: Secondary | ICD-10-CM

## 2019-01-05 ENCOUNTER — Encounter: Payer: BLUE CROSS/BLUE SHIELD | Admitting: Family Medicine

## 2019-01-05 ENCOUNTER — Ambulatory Visit: Payer: BLUE CROSS/BLUE SHIELD | Admitting: Family Medicine

## 2019-01-16 ENCOUNTER — Ambulatory Visit (INDEPENDENT_AMBULATORY_CARE_PROVIDER_SITE_OTHER): Payer: BC Managed Care – PPO | Admitting: Family Medicine

## 2019-01-16 ENCOUNTER — Other Ambulatory Visit: Payer: Self-pay

## 2019-01-16 ENCOUNTER — Encounter: Payer: Self-pay | Admitting: Family Medicine

## 2019-01-16 DIAGNOSIS — E039 Hypothyroidism, unspecified: Secondary | ICD-10-CM

## 2019-01-16 DIAGNOSIS — F3342 Major depressive disorder, recurrent, in full remission: Secondary | ICD-10-CM | POA: Diagnosis not present

## 2019-01-16 DIAGNOSIS — E785 Hyperlipidemia, unspecified: Secondary | ICD-10-CM | POA: Diagnosis not present

## 2019-01-16 DIAGNOSIS — R739 Hyperglycemia, unspecified: Secondary | ICD-10-CM | POA: Insufficient documentation

## 2019-01-16 DIAGNOSIS — Z6839 Body mass index (BMI) 39.0-39.9, adult: Secondary | ICD-10-CM

## 2019-01-16 NOTE — Progress Notes (Signed)
Virtual Visit via Video Note  I connected with Andrea Delgado Petre   on 01/16/19 at  2:00 PM EDT by a video enabled telemedicine application and verified that I am speaking with the correct person using two identifiers.  Location patient: home Location provider:work office Persons participating in the virtual visit: patient, provider  I discussed the limitations of evaluation and management by telemedicine and the availability of in person appointments. The patient expressed understanding and agreed to proceed.  Video failed; completed by phone call  Andrea Delgado Kilbride DOB: 1970/10/16 Encounter date: 01/16/2019  This is a 48 y.o. female who presents to establish care. Chief Complaint  Patient presents with  . Transitions Of Care    History of present illness: No specific concerns today. Last bloodwork was 06/2018.   Hypothyroid:synthroid. Was seeing Dr. Talmage NapBalan for facial hair issue initially and was taking spironolactone and then thyroid issue was diagnosed. Since then she has been put on the synthroid. Gets yearly check ups with her for this. Last labs were April-may of this year and was stable.   ZO:XWRUEAVHL:lipitor 20mg   Depression: zoloft: 100mg . Feels that hormonal swings are still playing role, but overall feels that mood is doing ok.   Allergies:xyzal,nasonex Hirsutism: spironolactone has helped.   Obesity is ongoing issue for her. Knows how to lose weight, but it is actual issue of doing it. She is always working on trying to be better. Not great about making time for exercise.   Last mammogram 01/2018; scheduled for repeat 01/2019.   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   Past Medical History:  Diagnosis Date  . Depression   . Hirsutism   . Hyperlipidemia   . Hypothyroidism 04/27/2016   -Seeing Dr. Talmage NapBalan endocrinology -Diagnosed in 2017 with her endocrinologist   . Seasonal allergies    Past Surgical History:  Procedure Laterality Date  . NASAL SINUS  SURGERY     may 2015, Dr. Annalee GentaShoemaker   Allergies  Allergen Reactions  . Codeine Nausea Only  . Penicillins     REACTION: rash   Current Meds  Medication Sig  . Ascorbic Acid (VITAMIN C PO) Take by mouth.  Marland Kitchen. atorvastatin (LIPITOR) 20 MG tablet TAKE 1 TABLET DAILY  . levocetirizine (XYZAL) 5 MG tablet TAKE 1 TABLET EVERY EVENING  . mometasone (NASONEX) 50 MCG/ACT nasal spray Place 2 sprays into the nose daily.  . Multiple Vitamin (MULTIVITAMINS PO) Take by mouth.  . sertraline (ZOLOFT) 100 MG tablet TAKE 1 TABLET DAILY  . spironolactone (ALDACTONE) 25 MG tablet Take 25 mg by mouth daily.    Marland Kitchen. SYNTHROID 75 MCG tablet    Social History   Tobacco Use  . Smoking status: Never Smoker  . Smokeless tobacco: Never Used  Substance Use Topics  . Alcohol use: No   Family History  Problem Relation Age of Onset  . Alcohol abuse Mother   . Cirrhosis Mother   . Heart disease Father   . Mental illness Father        dementia     Review of Systems  Constitutional: Negative for chills, fatigue and fever.  Respiratory: Negative for cough, chest tightness, shortness of breath and wheezing.   Cardiovascular: Negative for chest pain, palpitations and leg swelling.    Objective:  There were no vitals taken for this visit.      BP Readings from Last 3 Encounters:  07/07/18 112/76  06/17/17 110/80  05/24/16 112/80   Wt Readings from Last 3  Encounters:  07/07/18 214 lb 6.4 oz (97.3 kg)  06/17/17 229 lb 9.6 oz (104.1 kg)  04/27/16 215 lb 4.8 oz (97.7 kg)    EXAM:  GENERAL: alert, oriented, sounds well and in no acute distress  LUNGS: no difficulty with breathing; no shortness of breath during discussion.  PSYCH/NEURO: pleasant and cooperative, no obvious depression or anxiety, speech and thought processing grossly intact   Assessment/Plan  1. Hyperlipidemia, unspecified hyperlipidemia type Continue with lipitor. - Comprehensive metabolic panel; Future - Lipid panel;  Future  2. Hypothyroidism, unspecified type Following with endo; up to date with tsh (although not in our records) - CBC with Differential/Platelet; Future  3. Class 2 severe obesity with serious comorbidity and body mass index (BMI) of 39.0 to 39.9 in adult, unspecified obesity type [facility]) Discussed importance of regular exercise. She is wanting to work on diet.   4. Recurrent major depressive disorder, in full remission ([place]) Stable. Continue zoloft.  5. Hyperglycemia: working on finding motivation to improve diet/exercise - Comprehensive metabolic panel; Future - Hemoglobin A1c; Future  Return in about 6 months (around 07/19/2019) for physical exam.    I discussed the assessment and treatment plan with the patient. The patient was provided an opportunity to ask questions and all were answered. The patient agreed with the plan and demonstrated an understanding of the instructions.   The patient was advised to call back or seek an in-person evaluation if the symptoms worsen or if the condition fails to improve as anticipated.  I provided 25 minutes of non-face-to-face time during this encounter.   Micheline Rough, MD

## 2019-01-19 ENCOUNTER — Telehealth: Payer: Self-pay | Admitting: *Deleted

## 2019-01-19 NOTE — Telephone Encounter (Signed)
Appt scheduled for physical with labs on 07/13/2019 as patient stated she prefers one visit.

## 2019-01-19 NOTE — Telephone Encounter (Signed)
-----   Message from Caren Macadam, MD sent at 01/16/2019  2:54 PM EDT ----- Please set up labwork and then physical in 6 months time.

## 2019-02-17 ENCOUNTER — Ambulatory Visit
Admission: RE | Admit: 2019-02-17 | Discharge: 2019-02-17 | Disposition: A | Discharge status: Home / Self Care | Payer: BC Managed Care – PPO | Source: Ambulatory Visit | Attending: Family Medicine | Admitting: Family Medicine

## 2019-02-17 ENCOUNTER — Other Ambulatory Visit: Payer: Self-pay

## 2019-02-17 DIAGNOSIS — Z1231 Encounter for screening mammogram for malignant neoplasm of breast: Secondary | ICD-10-CM | POA: Diagnosis not present

## 2019-03-10 ENCOUNTER — Telehealth: Payer: Self-pay | Admitting: *Deleted

## 2019-03-10 NOTE — Telephone Encounter (Signed)
[facility] faxed a refill request for Atorvastatin 20mg .  Message sent to Dr Ethlyn Gallery.

## 2019-03-10 NOTE — Telephone Encounter (Signed)
Also requested a refill on Sertraline

## 2019-03-11 MED ORDER — SERTRALINE HCL 100 MG PO TABS: 100.0000 mg | ORAL_TABLET | Freq: Every day | ORAL | 1 refills | Status: DC

## 2019-03-11 MED ORDER — ATORVASTATIN CALCIUM 20 MG PO TABS: 20.0000 mg | ORAL_TABLET | Freq: Every day | ORAL | 1 refills | Status: DC

## 2019-03-11 NOTE — Telephone Encounter (Signed)
[place] for 90 day w refill

## 2019-03-11 NOTE — Telephone Encounter (Signed)
Rx done. 

## 2019-06-25 ENCOUNTER — Encounter: Payer: Self-pay | Admitting: Family Medicine

## 2019-06-28 NOTE — Telephone Encounter (Signed)
See patient message.  Please make sure upcoming appointment is virtual.

## 2019-06-29 NOTE — Telephone Encounter (Signed)
Information below added to appt notes for the visit on 2/24.

## 2019-07-13 ENCOUNTER — Encounter: Payer: BC Managed Care – PPO | Admitting: Family Medicine

## 2019-07-14 ENCOUNTER — Other Ambulatory Visit: Payer: Self-pay

## 2019-07-15 ENCOUNTER — Other Ambulatory Visit (INDEPENDENT_AMBULATORY_CARE_PROVIDER_SITE_OTHER): Payer: BC Managed Care – PPO

## 2019-07-15 DIAGNOSIS — R739 Hyperglycemia, unspecified: Secondary | ICD-10-CM | POA: Diagnosis not present

## 2019-07-15 DIAGNOSIS — E785 Hyperlipidemia, unspecified: Secondary | ICD-10-CM

## 2019-07-15 DIAGNOSIS — E039 Hypothyroidism, unspecified: Secondary | ICD-10-CM | POA: Diagnosis not present

## 2019-07-15 LAB — CBC WITH DIFFERENTIAL/PLATELET
Basophils Absolute: 0 10*3/uL (ref 0.0–0.1)
Basophils Relative: 0.3 % (ref 0.0–3.0)
Eosinophils Absolute: 0.2 10*3/uL (ref 0.0–0.7)
Eosinophils Relative: 2.5 % (ref 0.0–5.0)
HCT: 40 % (ref 36.0–46.0)
Hemoglobin: 13.6 g/dL (ref 12.0–15.0)
Lymphocytes Relative: 13.8 % (ref 12.0–46.0)
Lymphs Abs: 1.1 10*3/uL (ref 0.7–4.0)
MCHC: 33.9 g/dL (ref 30.0–36.0)
MCV: 92.1 fl (ref 78.0–100.0)
Monocytes Absolute: 0.5 10*3/uL (ref 0.1–1.0)
Monocytes Relative: 6.5 % (ref 3.0–12.0)
Neutro Abs: 6.1 10*3/uL (ref 1.4–7.7)
Neutrophils Relative %: 76.9 % (ref 43.0–77.0)
Platelets: 281 10*3/uL (ref 150.0–400.0)
RBC: 4.35 Mil/uL (ref 3.87–5.11)
RDW: 12 % (ref 11.5–15.5)
WBC: 7.9 10*3/uL (ref 4.0–10.5)

## 2019-07-15 LAB — LIPID PANEL
Cholesterol: 180 mg/dL (ref 0–200)
HDL: 51.4 mg/dL (ref >39.00)
LDL Cholesterol: 100 mg/dL — ABNORMAL HIGH (ref 0–99)
NonHDL: 128.25
Total CHOL/HDL Ratio: 3
Triglycerides: 141 mg/dL (ref 0.0–149.0)
VLDL: 28.2 mg/dL (ref 0.0–40.0)

## 2019-07-15 LAB — COMPREHENSIVE METABOLIC PANEL
ALT: 13 U/L (ref 0–35)
AST: 14 U/L (ref 0–37)
Albumin: 4.2 g/dL (ref 3.5–5.2)
Alkaline Phosphatase: 77 U/L (ref 39–117)
BUN: 13 mg/dL (ref 6–23)
CO2: 24 mEq/L (ref 19–32)
Calcium: 9.3 mg/dL (ref 8.4–10.5)
Chloride: 102 mEq/L (ref 96–112)
Creatinine, Ser: 0.7 mg/dL (ref 0.40–1.20)
GFR: 88.85 mL/min (ref >60.00)
Glucose, Bld: 93 mg/dL (ref 70–99)
Potassium: 4 mEq/L (ref 3.5–5.1)
Sodium: 136 mEq/L (ref 135–145)
Total Bilirubin: 0.4 mg/dL (ref 0.2–1.2)
Total Protein: 6.6 g/dL (ref 6.0–8.3)

## 2019-07-15 LAB — HEMOGLOBIN A1C: Hgb A1c MFr Bld: 6.3 % (ref 4.6–6.5)

## 2019-07-22 ENCOUNTER — Encounter: Payer: Self-pay | Admitting: Family Medicine

## 2019-07-22 ENCOUNTER — Telehealth (INDEPENDENT_AMBULATORY_CARE_PROVIDER_SITE_OTHER): Payer: BC Managed Care – PPO | Admitting: Family Medicine

## 2019-07-22 ENCOUNTER — Other Ambulatory Visit: Payer: Self-pay

## 2019-07-22 ENCOUNTER — Telehealth: Payer: Self-pay | Admitting: *Deleted

## 2019-07-22 DIAGNOSIS — F3342 Major depressive disorder, recurrent, in full remission: Secondary | ICD-10-CM | POA: Diagnosis not present

## 2019-07-22 DIAGNOSIS — E785 Hyperlipidemia, unspecified: Secondary | ICD-10-CM

## 2019-07-22 DIAGNOSIS — E039 Hypothyroidism, unspecified: Secondary | ICD-10-CM

## 2019-07-22 DIAGNOSIS — Z6839 Body mass index (BMI) 39.0-39.9, adult: Secondary | ICD-10-CM

## 2019-07-22 DIAGNOSIS — R739 Hyperglycemia, unspecified: Secondary | ICD-10-CM | POA: Diagnosis not present

## 2019-07-22 NOTE — Patient Instructions (Signed)
Goal for eating: 3 carb choices per meal (or less). Each carb choice is 15g carbohydrates.

## 2019-07-22 NOTE — Progress Notes (Signed)
Virtual Visit via Video Note  I connected with Andrea Delgado on 07/22/19 at  9:30 AM EST by a video enabled telemedicine application and verified that I am speaking with the correct person using two identifiers.  Location patient: home Location provider:work or home office Persons participating in the virtual visit: patient, provider  I discussed the limitations of evaluation and management by telemedicine and the availability of in person appointments. The patient expressed understanding and agreed to proceed.   Andrea Delgado DOB: [birth year] Encounter date: 07/22/2019  This is a 49 y.o. female who presents with Chief Complaint  Patient presents with  . Follow-up    discuss labs    History of present illness:   Hypothyroid:synthroid. Has appointment with Dr. Chalmers Cater coming up in May.   HL: lipitor 20mg . Doing ok with this overall.   Depression: zoloft: 100mg . 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 Just looking forward to getting back to more normal.   Allergies:xyzal,nasonex. Controlled. Sinuses tend to act up more in winter. Infection rate has improved since sinus surgery.   Last mammogram 01/2019.   Tells weight by her clothes. Doesn't like to weigh regularly as she feels discouraged. Does sit majority of day; working from home.   Allergies  Allergen Reactions  . Codeine Nausea Only  . Penicillins     REACTION: rash   Current Meds  Medication Sig  . Ascorbic Acid (VITAMIN C PO) Take by mouth.  Marland Kitchen atorvastatin (LIPITOR) 20 MG tablet Take 1 tablet (20 mg total) by mouth daily.  Marland Kitchen levocetirizine (XYZAL) 5 MG tablet TAKE 1 TABLET EVERY EVENING  . mometasone (NASONEX) 50 MCG/ACT nasal spray Place 2 sprays into the nose daily.  . Multiple Vitamin (MULTIVITAMINS PO) Take by mouth.  . sertraline (ZOLOFT) 100 MG tablet Take 1 tablet (100 mg total) by mouth daily.  Marland Kitchen spironolactone (ALDACTONE) 25 MG tablet Take 25 mg by mouth  daily.    Marland Kitchen SYNTHROID 75 MCG tablet     Review of Systems  Constitutional: Negative for chills, fatigue and fever.  Respiratory: Negative for cough, chest tightness, shortness of breath and wheezing.   Cardiovascular: Negative for chest pain, palpitations and leg swelling.    Objective: Not checking blood pressures at home.  There were no vitals taken for this visit.      BP Readings from Last 3 Encounters:  07/07/18 112/76  06/17/17 110/80  05/24/16 112/80   Wt Readings from Last 3 Encounters:  07/07/18 214 lb 6.4 oz (97.3 kg)  06/17/17 229 lb 9.6 oz (104.1 kg)  04/27/16 215 lb 4.8 oz (97.7 kg)    EXAM:  GENERAL: alert, oriented, appears well and in no acute distress  HEENT: atraumatic, conjunctiva clear, no obvious abnormalities on inspection of external nose and ears  NECK: normal movements of the head and neck  LUNGS: on inspection no signs of respiratory distress, breathing rate appears normal, no obvious gross SOB, gasping or wheezing  CV: no obvious cyanosis  MS: moves all visible extremities without noticeable abnormality  PSYCH/NEURO: pleasant and cooperative, no obvious depression or anxiety, speech and thought processing grossly intact  Assessment/Plan  1. Hypothyroidism, unspecified type Continue care with Dr. Chalmers Cater.   2. Hyperglycemia A1C elevating. Discussed low carb diet; will mail diabetic diet handout. Consider nutrition referral. Discussed importance of exercise. - Hemoglobin A1c; Future  3. Recurrent major depressive disorder, in full remission ([place]) Mood stable. Continue with zoloft.  4. Hyperlipidemia, unspecified  hyperlipidemia type Cholesterol stable. Continue with lipitor.   5. Class 2 severe obesity with serious comorbidity and body mass index (BMI) of 39.0 to 39.9 in adult, unspecified obesity type ([facility]) I feel weight loss will be easier w low carb diet. Discussed today. She doesn't want physical this year, but we will repeat A1C in 6  months time and follow up pending this.    Return for A1C in 6 months.   I discussed the assessment and treatment plan with the patient. The patient was provided an opportunity to ask questions and all were answered. The patient agreed with the plan and demonstrated an understanding of the instructions.   The patient was advised to call back or seek an in-person evaluation if the symptoms worsen or if the condition fails to improve as anticipated.  I provided 20 minutes of non-face-to-face time during this encounter.   Theodis Shove, MD

## 2019-07-22 NOTE — Telephone Encounter (Signed)
-----   Message from Wynn Banker, MD sent at 07/22/2019  9:56 AM EST ----- Please mail her diabetic diet info. Set up physical in the fall for her #around 6 months from now.

## 2019-07-22 NOTE — Telephone Encounter (Signed)
Per Dr Hassan Rowan the pt only requests a lab appt and not a physical.  I called the pt and informed her the diabetic diet was mailed to the home address and lab appt scheduled for 8/24 to check in at 9:30am.

## 2019-07-27 DIAGNOSIS — J302 Other seasonal allergic rhinitis: Secondary | ICD-10-CM | POA: Diagnosis not present

## 2019-08-04 ENCOUNTER — Other Ambulatory Visit: Payer: Self-pay | Admitting: Family Medicine

## 2019-08-09 IMAGING — MG DIGITAL SCREENING BILATERAL MAMMOGRAM WITH TOMO AND CAD
8 series · 8 of 24 positions shown · non-contrast
Comparison: Previous exam(s).

CLINICAL DATA: Screening.

EXAM:
DIGITAL SCREENING BILATERAL MAMMOGRAM WITH TOMO AND CAD

[R CC synth-2D]
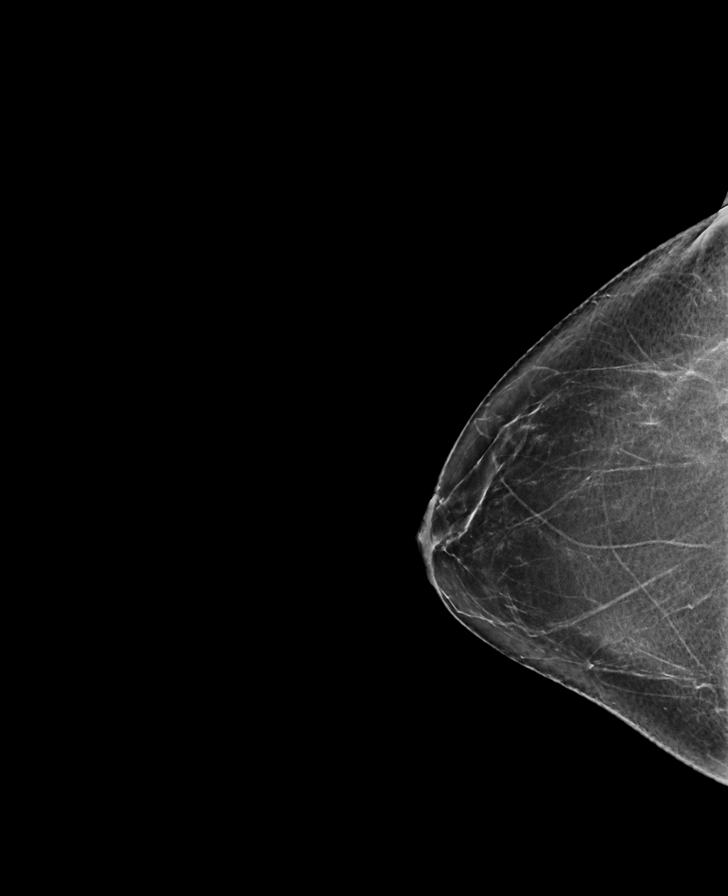

[R MLO synth-2D]
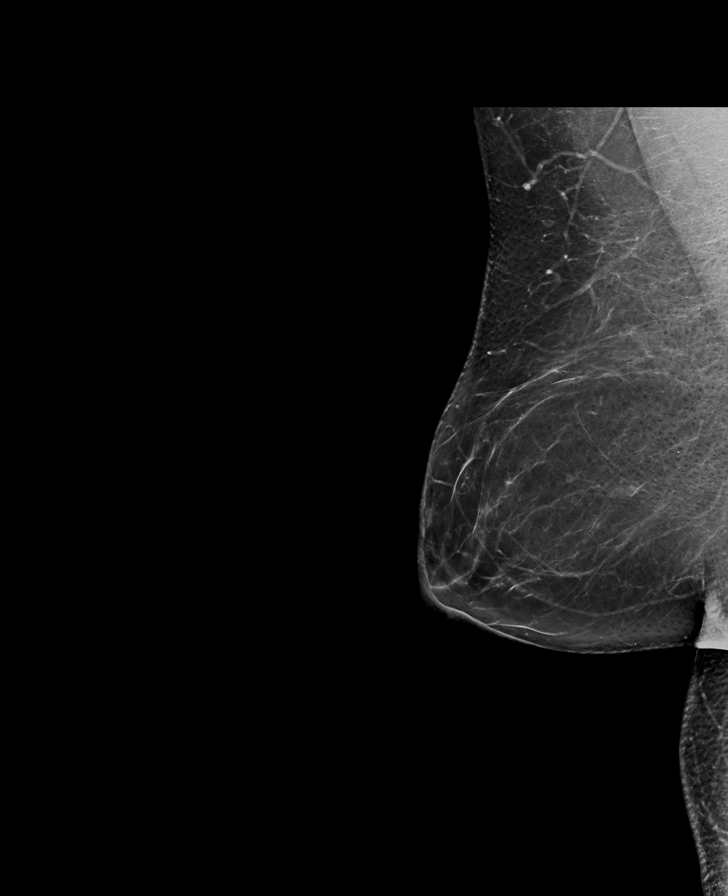

[L CC synth-2D]
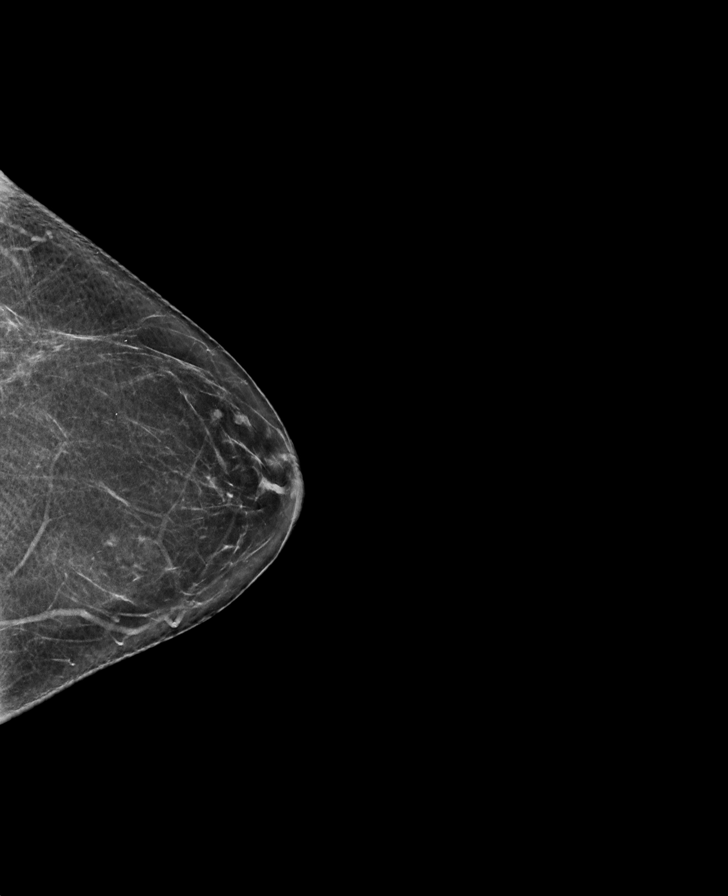

[L MLO synth-2D]
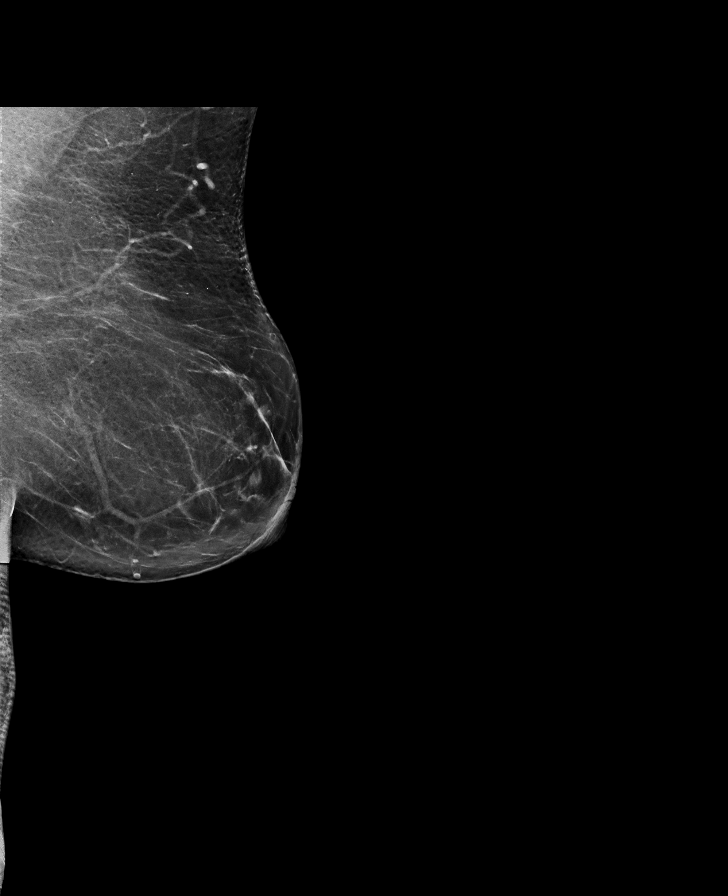

[R MLO tomo · tomo slice 39/77.0]
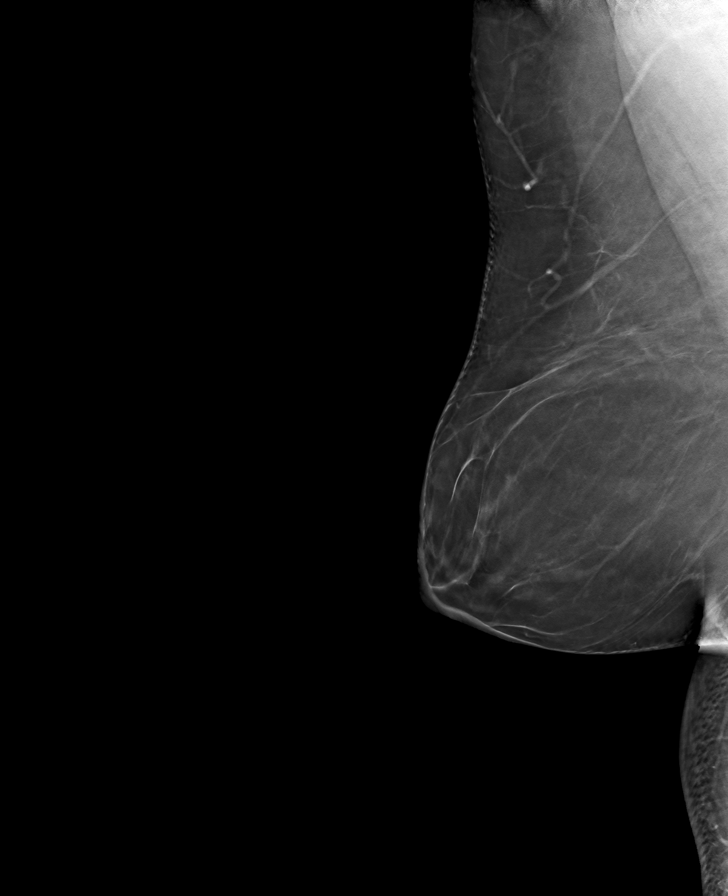

[L CC tomo · tomo slice 32/63.0]
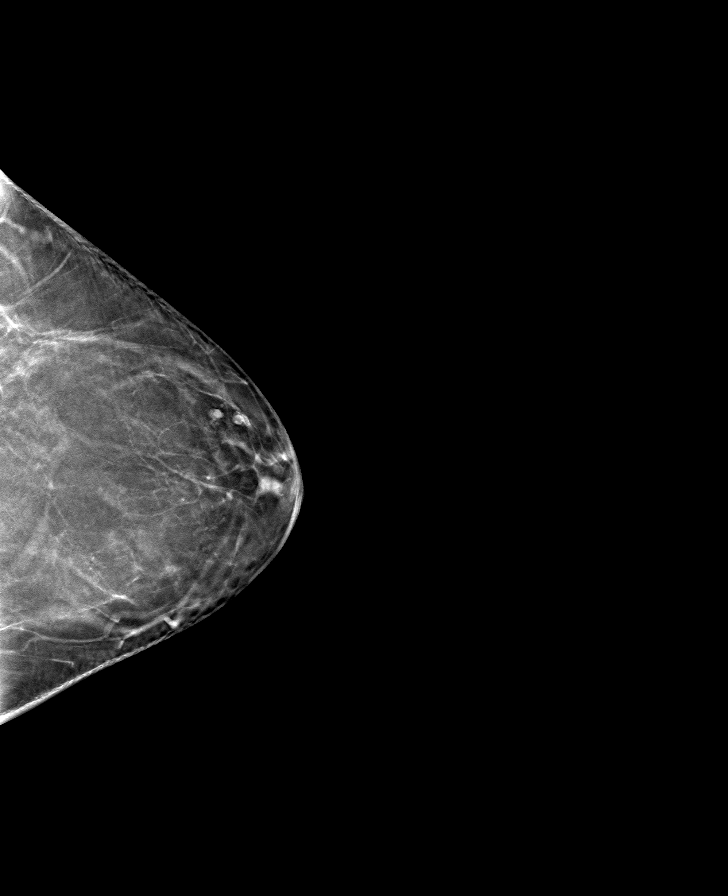

[R CC tomo · tomo slice 35/69.0]
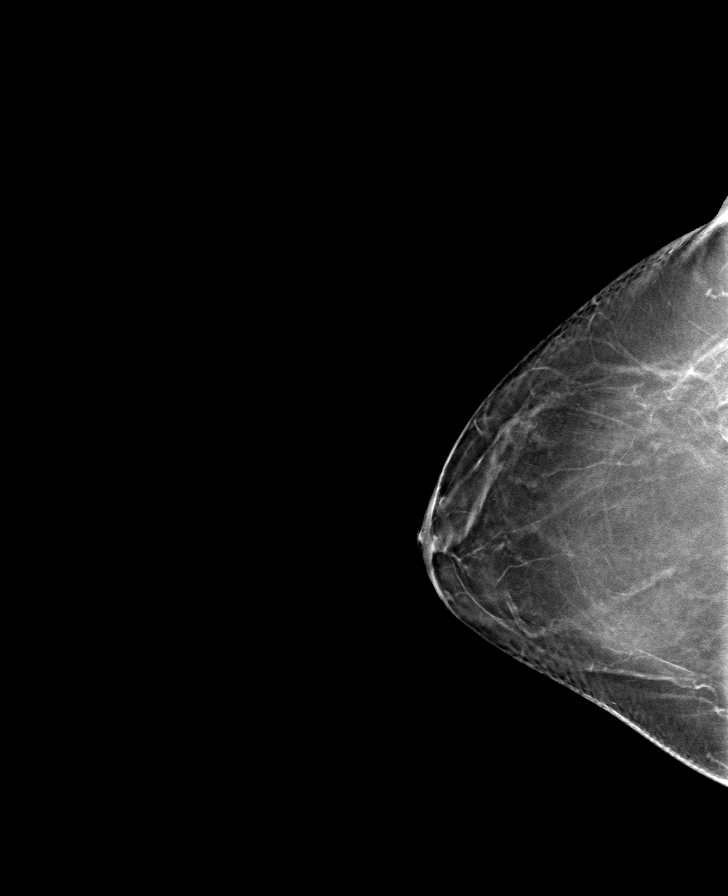

[L MLO tomo · tomo slice 39/76.0]
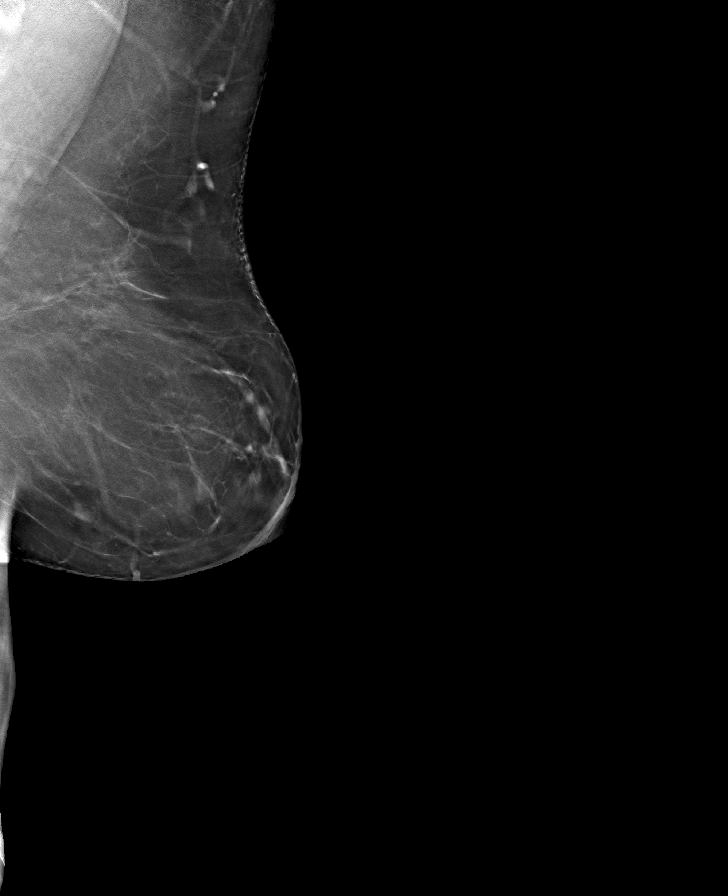

[8 of 24 positions shown; findings below may reference images not displayed]

ACR Breast Density Category b: There are scattered areas of
fibroglandular density.
FINDINGS: There are no findings suspicious for malignancy. Images were
processed with CAD.
IMPRESSION: No mammographic evidence of malignancy. A result letter of this
screening mammogram will be mailed directly to the patient.

RECOMMENDATION:
Screening mammogram in one year. (Code:[id number])

BI-RADS CATEGORY  1: Negative.

## 2019-10-02 DIAGNOSIS — L680 Hirsutism: Secondary | ICD-10-CM | POA: Diagnosis not present

## 2019-10-02 DIAGNOSIS — E039 Hypothyroidism, unspecified: Secondary | ICD-10-CM | POA: Diagnosis not present

## 2019-10-12 DIAGNOSIS — L680 Hirsutism: Secondary | ICD-10-CM | POA: Diagnosis not present

## 2019-10-12 DIAGNOSIS — E039 Hypothyroidism, unspecified: Secondary | ICD-10-CM | POA: Diagnosis not present

## 2019-12-28 NOTE — Addendum Note (Signed)
Addended by: Valree Feild. M on: 12/28/2019 03:40 PM ° ° Modules accepted: Orders ° °

## 2020-01-06 ENCOUNTER — Other Ambulatory Visit: Payer: Self-pay | Admitting: Family Medicine

## 2020-01-06 DIAGNOSIS — Z1231 Encounter for screening mammogram for malignant neoplasm of breast: Secondary | ICD-10-CM

## 2020-01-15 ENCOUNTER — Other Ambulatory Visit: Payer: Self-pay | Admitting: Family Medicine

## 2020-01-19 ENCOUNTER — Other Ambulatory Visit: Payer: Self-pay

## 2020-01-19 ENCOUNTER — Other Ambulatory Visit: Payer: BC Managed Care – PPO

## 2020-01-19 DIAGNOSIS — R739 Hyperglycemia, unspecified: Secondary | ICD-10-CM | POA: Diagnosis not present

## 2020-01-19 DIAGNOSIS — E785 Hyperlipidemia, unspecified: Secondary | ICD-10-CM | POA: Diagnosis not present

## 2020-01-20 LAB — HEMOGLOBIN A1C
Hgb A1c MFr Bld: 5.6 % of total Hgb (ref <5.7)
Mean Plasma Glucose: 114 (calc)
eAG (mmol/L): 6.3 (calc)

## 2020-02-19 ENCOUNTER — Ambulatory Visit
Admission: RE | Admit: 2020-02-19 | Discharge: 2020-02-19 | Disposition: A | Discharge status: Home / Self Care | Payer: BC Managed Care – PPO | Source: Ambulatory Visit | Attending: Family Medicine | Admitting: Family Medicine

## 2020-02-19 ENCOUNTER — Other Ambulatory Visit: Payer: Self-pay

## 2020-02-19 DIAGNOSIS — Z1231 Encounter for screening mammogram for malignant neoplasm of breast: Secondary | ICD-10-CM | POA: Diagnosis not present

## 2020-03-31 DIAGNOSIS — H0102A Squamous blepharitis right eye, upper and lower eyelids: Secondary | ICD-10-CM | POA: Diagnosis not present

## 2020-03-31 DIAGNOSIS — H2513 Age-related nuclear cataract, bilateral: Secondary | ICD-10-CM | POA: Diagnosis not present

## 2020-03-31 DIAGNOSIS — H35032 Hypertensive retinopathy, left eye: Secondary | ICD-10-CM | POA: Diagnosis not present

## 2020-03-31 DIAGNOSIS — H25013 Cortical age-related cataract, bilateral: Secondary | ICD-10-CM | POA: Diagnosis not present

## 2020-04-23 ENCOUNTER — Other Ambulatory Visit: Payer: Self-pay | Admitting: Family Medicine

## 2020-06-13 ENCOUNTER — Other Ambulatory Visit: Payer: Self-pay | Admitting: Family Medicine

## 2020-07-18 ENCOUNTER — Other Ambulatory Visit: Payer: Self-pay | Admitting: Family Medicine

## 2020-10-04 ENCOUNTER — Other Ambulatory Visit: Payer: Self-pay

## 2020-10-05 ENCOUNTER — Encounter: Payer: Self-pay | Admitting: Family Medicine

## 2020-10-05 ENCOUNTER — Ambulatory Visit (INDEPENDENT_AMBULATORY_CARE_PROVIDER_SITE_OTHER): Payer: BC Managed Care – PPO | Admitting: Family Medicine

## 2020-10-05 VITALS — BP 110/82 | HR 88 | Temp 97.8°F | Ht 63.0 in | Wt 217.3 lb

## 2020-10-05 DIAGNOSIS — E785 Hyperlipidemia, unspecified: Secondary | ICD-10-CM

## 2020-10-05 DIAGNOSIS — R8290 Unspecified abnormal findings in urine: Secondary | ICD-10-CM

## 2020-10-05 DIAGNOSIS — R7301 Impaired fasting glucose: Secondary | ICD-10-CM

## 2020-10-05 DIAGNOSIS — Z1211 Encounter for screening for malignant neoplasm of colon: Secondary | ICD-10-CM

## 2020-10-05 DIAGNOSIS — Z0000 Encounter for general adult medical examination without abnormal findings: Secondary | ICD-10-CM

## 2020-10-05 DIAGNOSIS — L409 Psoriasis, unspecified: Secondary | ICD-10-CM

## 2020-10-05 DIAGNOSIS — E039 Hypothyroidism, unspecified: Secondary | ICD-10-CM

## 2020-10-05 DIAGNOSIS — Z23 Encounter for immunization: Secondary | ICD-10-CM | POA: Diagnosis not present

## 2020-10-05 LAB — COMPREHENSIVE METABOLIC PANEL
ALT: 15 U/L (ref 0–35)
AST: 16 U/L (ref 0–37)
Albumin: 4.6 g/dL (ref 3.5–5.2)
Alkaline Phosphatase: 79 U/L (ref 39–117)
BUN: 18 mg/dL (ref 6–23)
CO2: 23 mEq/L (ref 19–32)
Calcium: 9.5 mg/dL (ref 8.4–10.5)
Chloride: 100 mEq/L (ref 96–112)
Creatinine, Ser: 0.82 mg/dL (ref 0.40–1.20)
GFR: 83.37 mL/min (ref >60.00)
Glucose, Bld: 100 mg/dL — ABNORMAL HIGH (ref 70–99)
Potassium: 4.3 mEq/L (ref 3.5–5.1)
Sodium: 134 mEq/L — ABNORMAL LOW (ref 135–145)
Total Bilirubin: 0.5 mg/dL (ref 0.2–1.2)
Total Protein: 7.3 g/dL (ref 6.0–8.3)

## 2020-10-05 LAB — CBC WITH DIFFERENTIAL/PLATELET
Basophils Absolute: 0 10*3/uL (ref 0.0–0.1)
Basophils Relative: 0.3 % (ref 0.0–3.0)
Eosinophils Absolute: 0.3 10*3/uL (ref 0.0–0.7)
Eosinophils Relative: 3.9 % (ref 0.0–5.0)
HCT: 41.5 % (ref 36.0–46.0)
Hemoglobin: 14.1 g/dL (ref 12.0–15.0)
Lymphocytes Relative: 17.8 % (ref 12.0–46.0)
Lymphs Abs: 1.4 10*3/uL (ref 0.7–4.0)
MCHC: 33.9 g/dL (ref 30.0–36.0)
MCV: 91.4 fl (ref 78.0–100.0)
Monocytes Absolute: 0.5 10*3/uL (ref 0.1–1.0)
Monocytes Relative: 6.4 % (ref 3.0–12.0)
Neutro Abs: 5.6 10*3/uL (ref 1.4–7.7)
Neutrophils Relative %: 71.6 % (ref 43.0–77.0)
Platelets: 339 10*3/uL (ref 150.0–400.0)
RBC: 4.54 Mil/uL (ref 3.87–5.11)
RDW: 11.6 % (ref 11.5–15.5)
WBC: 7.8 10*3/uL (ref 4.0–10.5)

## 2020-10-05 LAB — LIPID PANEL
Cholesterol: 190 mg/dL (ref 0–200)
HDL: 50.6 mg/dL (ref >39.00)
LDL Cholesterol: 106 mg/dL — ABNORMAL HIGH (ref 0–99)
NonHDL: 139.2
Total CHOL/HDL Ratio: 4
Triglycerides: 165 mg/dL — ABNORMAL HIGH (ref 0.0–149.0)
VLDL: 33 mg/dL (ref 0.0–40.0)

## 2020-10-05 LAB — HEMOGLOBIN A1C: Hgb A1c MFr Bld: 5.8 % (ref 4.6–6.5)

## 2020-10-05 NOTE — Progress Notes (Signed)
Andrea Delgado DOB: Apr 07, 1971 Encounter date: 10/05/2020  This is a 50 y.o. female who presents for complete physical   History of present illness/Additional concerns:  Last visit with me was 06/2019. All of our visits have been virtual due to pandemic up to this point.   Last weekend started noting more pressure sensation/full bladder sensation but not having urination to match. Felt that urine had different odor - but hasn't noted smell since then or fullness. Had azo home strips and checked at home - was positive for leukocytes. No burning, no frequency.   Hypothyroid:synthroid. Has appointment with Dr. Talmage Nap next week.   HL: lipitor 20mg . Doing ok with this overall.   Depression: zoloft: 100mg . Sister passed away last 05/11/23 from COVID. She lived in . Some stressors in life. Mother is doing well; still caring for her through pandemic.   Allergies:xyzal,nasonex. Controlled. Doing pretty well this year.  Last mammogram 01/2020.   Past Medical History:  Diagnosis Date  . Depression   . Hirsutism   . Hyperlipidemia   . Hypothyroidism 04/27/2016   -Seeing Dr. 02/2020 endocrinology -Diagnosed in 2017 with her endocrinologist   . Seasonal allergies    Past Surgical History:  Procedure Laterality Date  . NASAL SINUS SURGERY     may 2015, Dr. 2018   Allergies  Allergen Reactions  . Codeine Nausea Only  . Penicillins     REACTION: rash   Current Meds  Medication Sig  . Ascorbic Acid (VITAMIN C PO) Take by mouth.  05-18-1981 atorvastatin (LIPITOR) 20 MG tablet TAKE 1 TABLET DAILY  . levocetirizine (XYZAL) 5 MG tablet TAKE 1 TABLET EVERY EVENING  . mometasone (NASONEX) 50 MCG/ACT nasal spray Place 2 sprays into the nose daily.  . Multiple Vitamin (MULTIVITAMINS PO) Take by mouth.  . sertraline (ZOLOFT) 100 MG tablet TAKE 1 TABLET DAILY  . spironolactone (ALDACTONE) 25 MG tablet Take 25 mg by mouth daily.  Annalee Genta SYNTHROID 75 MCG tablet    Social History    Tobacco Use  . Smoking status: Never Smoker  . Smokeless tobacco: Never Used  Substance Use Topics  . Alcohol use: No   Family History  Problem Relation Age of Onset  . Alcohol abuse Mother   . Cirrhosis Mother   . Heart disease Father   . Mental illness Father        dementia  . High Cholesterol Sister   . High Cholesterol Brother   . Irritable bowel syndrome Brother      Review of Systems  Constitutional: Negative for activity change, appetite change, chills, fatigue, fever and unexpected weight change.  HENT: Negative for congestion, ear pain, hearing loss, sinus pressure, sinus pain, sore throat and trouble swallowing.   Eyes: Negative for pain and visual disturbance.  Respiratory: Negative for cough, chest tightness, shortness of breath and wheezing.   Cardiovascular: Negative for chest pain, palpitations and leg swelling.  Gastrointestinal: Negative for abdominal pain, blood in stool, constipation, diarrhea, nausea and vomiting.  Genitourinary: Negative for decreased urine volume, difficulty urinating, flank pain and menstrual problem. Urgency: this has improved somewhat.  Musculoskeletal: Negative for arthralgias and back pain.  Skin: Negative for rash.  Neurological: Negative for dizziness, weakness, numbness and headaches.  Hematological: Negative for adenopathy. Does not bruise/bleed easily.  Psychiatric/Behavioral: Negative for sleep disturbance (she does snore, but no daytime fatigue; feels well rested with sleep) and suicidal ideas. The patient is not nervous/anxious.     CBC:  Lab Results  Component Value Date   WBC 7.9 07/15/2019   HGB 13.6 07/15/2019   HCT 40.0 07/15/2019   MCHC 33.9 07/15/2019   RDW 12.0 07/15/2019   PLT 281.0 07/15/2019   CMP: Lab Results  Component Value Date   NA 136 07/15/2019   K 4.0 07/15/2019   CL 102 07/15/2019   CO2 24 07/15/2019   GLUCOSE 93 07/15/2019   BUN 13 07/15/2019   CREATININE 0.70 07/15/2019   CALCIUM 9.3  07/15/2019   PROT 6.6 07/15/2019   BILITOT 0.4 07/15/2019   ALKPHOS 77 07/15/2019   ALT 13 07/15/2019   AST 14 07/15/2019   LIPID: Lab Results  Component Value Date   CHOL 180 07/15/2019   TRIG 141.0 07/15/2019   HDL 51.40 07/15/2019   LDLCALC 100 (H) 07/15/2019    Objective:  BP 110/82 (BP Location: Left Arm, Patient Position: Sitting, Cuff Size: Large)   Pulse 88   Temp 97.8 F (36.6 C) (Oral)   Ht 5\' 3"  (1.6 m)   Wt 217 lb 4.8 oz (98.6 kg)   LMP 09/21/2020 (Approximate)   SpO2 97%   BMI 38.49 kg/m   Weight: 217 lb 4.8 oz (98.6 kg)   BP Readings from Last 3 Encounters:  10/05/20 110/82  07/07/18 112/76  06/17/17 110/80   Wt Readings from Last 3 Encounters:  10/05/20 217 lb 4.8 oz (98.6 kg)  07/07/18 214 lb 6.4 oz (97.3 kg)  06/17/17 229 lb 9.6 oz (104.1 kg)    Physical Exam Constitutional:      General: She is not in acute distress.    Appearance: She is well-developed. She is obese.  HENT:     Head: Normocephalic and atraumatic.     Right Ear: External ear normal.     Left Ear: External ear normal.     Mouth/Throat:     Pharynx: No oropharyngeal exudate.  Eyes:     Conjunctiva/sclera: Conjunctivae normal.     Pupils: Pupils are equal, round, and reactive to light.  Neck:     Thyroid: No thyromegaly.  Cardiovascular:     Rate and Rhythm: Normal rate and regular rhythm.     Heart sounds: Normal heart sounds. No murmur heard. No friction rub. No gallop.   Pulmonary:     Effort: Pulmonary effort is normal.     Breath sounds: Normal breath sounds.  Abdominal:     General: Bowel sounds are normal. There is no distension.     Palpations: Abdomen is soft. There is no mass.     Tenderness: There is no abdominal tenderness. There is no guarding.     Hernia: No hernia is present.  Musculoskeletal:        General: No tenderness or deformity. Normal range of motion.     Cervical back: Normal range of motion and neck supple.  Lymphadenopathy:     Cervical:  No cervical adenopathy.  Skin:    General: Skin is warm and dry.     Findings: No rash.     Comments: Scaling, plaques lower occipital scalp  Capillary hemangiomas scattered skin Has 1.5cm irregular birthmark left lower abd/inguinal crease - patient states it has always been there and is without change.   Neurological:     Mental Status: She is alert and oriented to person, place, and time.     Deep Tendon Reflexes: Reflexes normal.     Reflex Scores:      Tricep reflexes are 2+ on the right side and  2+ on the left side.      Bicep reflexes are 2+ on the right side and 2+ on the left side.      Brachioradialis reflexes are 2+ on the right side and 2+ on the left side.      Patellar reflexes are 2+ on the right side and 2+ on the left side. Psychiatric:        Speech: Speech normal.        Behavior: Behavior normal.        Thought Content: Thought content normal.    Depression screen [facility] 2/9 10/05/2020 07/22/2019 01/16/2019 07/07/2018 06/17/2017  Decreased Interest 0 0 1 0 0  Down, Depressed, Hopeless 0 0 0 0 0  PHQ - 2 Score 0 0 1 0 0  Altered sleeping 1 0 - 0 -  Tired, decreased energy 0 0 - 0 -  Change in appetite 0 0 - 0 -  Feeling bad or failure about yourself  0 0 - 0 -  Trouble concentrating 0 0 - 0 -  Moving slowly or fidgety/restless 0 0 - 0 -  Suicidal thoughts 0 0 - 0 -  PHQ-9 Score 1 0 - 0 -     Assessment/Plan: Health Maintenance Due  Topic Date Due  . COLONOSCOPY (Pts 45-37yrs Insurance coverage will need to be confirmed)  Never done  . TETANUS/TDAP  08/19/2019   Health Maintenance reviewed.  1. Preventative health care We discussed importance of regular exercise in daily life.  - Tdap vaccine greater than or equal to 7yo IM  2. Colon cancer screening - Cologuard  3. Abnormal urine odor - Urinalysis with Culture Reflex; Future - CBC with Differential/Platelet; Future  4. Impaired fasting glucose - Hemoglobin A1c; Future  5. Hypothyroidism,  unspecified type Follows with endo; has labwork with them as well as upcoming visit next week.   6. Hyperlipidemia, unspecified hyperlipidemia type - Comprehensive metabolic panel; Future - Lipid panel; Future  7. Scalp psoriasis She prefers to just monitor, but would be good candidate for liquid steroid treatment to scalp if desired.   She plans to get 4th covid shot soon; she will consider shingrix for future visit.  Return in about 1 year (around 10/05/2021) for physical exam.  Theodis Shove, MD

## 2020-10-05 NOTE — Patient Instructions (Addendum)
Zoster Vaccine, Recombinant injection What is this medicine? ZOSTER VACCINE (ZOS ter vak SEEN) is a vaccine used to reduce the risk of getting shingles. This vaccine is not used to treat shingles or nerve pain from shingles. This medicine may be used for other purposes; ask your health care provider or pharmacist if you have questions. COMMON BRAND NAME(S): [facility] What should I tell my health care provider before I take this medicine? They need to know if you have any of these conditions:  cancer  immune system problems  an unusual or allergic reaction to Zoster vaccine, other medications, foods, dyes, or preservatives  pregnant or trying to get pregnant  breast-feeding How should I use this medicine? This vaccine is injected into a muscle. It is given by a health care provider. A copy of Vaccine Information Statements will be given before each vaccination. Be sure to read this information carefully each time. This sheet may change often. Talk to your health care provider about the use of this vaccine in children. This vaccine is not approved for use in children. Overdosage: If you think you have taken too much of this medicine contact a poison control center or emergency room at once. NOTE: This medicine is only for you. Do not share this medicine with others. What if I miss a dose? Keep appointments for follow-up (booster) doses. It is important not to miss your dose. Call your health care provider if you are unable to keep an appointment. What may interact with this medicine?  medicines that suppress your immune system  medicines to treat cancer  steroid medicines like prednisone or cortisone This list may not describe all possible interactions. Give your health care provider a list of all the medicines, herbs, non-prescription drugs, or dietary supplements you use. Also tell them if you smoke, drink alcohol, or use illegal drugs. Some items may interact with your medicine. What  should I watch for while using this medicine? Visit your health care provider regularly. This vaccine, like all vaccines, may not fully protect everyone. What side effects may I notice from receiving this medicine? Side effects that you should report to your doctor or health care professional as soon as possible:  allergic reactions (skin rash, itching or hives; swelling of the face, lips, or tongue)  trouble breathing Side effects that usually do not require medical attention (report these to your doctor or health care professional if they continue or are bothersome):  chills  headache  fever  nausea  pain, redness, or irritation at site where injected  tiredness  vomiting This list may not describe all possible side effects. Call your doctor for medical advice about side effects. You may report side effects to FDA at 1-800-FDA-1088. Where should I keep my medicine? This vaccine is only given by a health care provider. It will not be stored at home. NOTE: This sheet is a summary. It may not cover all possible information. If you have questions about this medicine, talk to your doctor, pharmacist, or health care provider.  2021 Elsevier/Gold Standard (2019-06-19 16:23:07)   Why is Exercise Important? If I told you I had a single pill that would help you decrease stress by improving anxiety, decreasing depression, help you achieve a healthy weight, give you more energy, make you more productive, help you focus, decrease your risk of dementia/heart attack/stroke/falls, improve your bone health, and more would you be interested? These are just some of the benefits that exercise brings to you. IT IS WORTH  carving out some time every day to fit in exercise. It will help in every aspect of your health. Even if you have injuries that prevent you from participating in a type of exercise you used to do; there is always something that you can do to keep exercise a part of your life. If  improving your health is important, make exercise your priority. It is worth the time! If you have questions about the type of exercise that is right for you, please talk with me about this!     Exercising to Stay Healthy  Exercising regularly is important. It has many health benefits, such as:  Improving your overall fitness, flexibility, and endurance.  Increasing your bone density.  Helping with weight control.  Decreasing your body fat.  Increasing your muscle strength.  Reducing stress and tension.  Improving your overall health.   In order to become healthy and stay healthy, it is recommended that you do moderate-intensity and vigorous-intensity exercise. You can tell that you are exercising at a moderate intensity if you have a higher heart rate and faster breathing, but you are still able to hold a conversation. You can tell that you are exercising at a vigorous intensity if you are breathing much harder and faster and cannot hold a conversation while exercising. How often should I exercise? Choose an activity that you enjoy and set realistic goals. Your health care provider can help you to make an activity plan that works for you. Exercise regularly as directed by your health care provider. This may include:  Doing resistance training twice each week, such as: ? Push-ups. ? Sit-ups. ? Lifting weights. ? Using resistance bands.  Doing a given intensity of exercise for a given amount of time. Choose from these options: ? 150 minutes of moderate-intensity exercise every week. ? 75 minutes of vigorous-intensity exercise every week. ? A mix of moderate-intensity and vigorous-intensity exercise every week.   Children, pregnant women, people who are out of shape, people who are overweight, and older adults may need to consult a health care provider for individual recommendations. If you have any sort of medical condition, be sure to consult your health care provider before  starting a new exercise program. What are some exercise ideas? Some moderate-intensity exercise ideas include:  Walking at a rate of 1 mile in 15 minutes.  Biking.  Hiking.  Golfing.  Dancing.   Some vigorous-intensity exercise ideas include:  Walking at a rate of at least 4.5 miles per hour.  Jogging or running at a rate of 5 miles per hour.  Biking at a rate of at least 10 miles per hour.  Lap swimming.  Roller-skating or in-line skating.  Cross-country skiing.  Vigorous competitive sports, such as football, basketball, and soccer.  Jumping rope.  Aerobic dancing.   What are some everyday activities that can help me to get exercise?  Yard work, such as: ? Pushing a Surveyor, mining. ? Raking and bagging leaves.  Washing and waxing your car.  Pushing a stroller.  Shoveling snow.  Gardening.  Washing windows or floors. How can I be more active in my day-to-day activities?  Use the stairs instead of the elevator.  Take a walk during your lunch break.  If you drive, park your car farther away from work or school.  If you take public transportation, get off one stop early and walk the rest of the way.  Make all of your phone calls while standing up and walking around.  Get up, stretch, and walk around every 30 minutes throughout the day. What guidelines should I follow while exercising?  Do not exercise so much that you hurt yourself, feel dizzy, or get very short of breath.  Consult your health care provider before starting a new exercise program.  Wear comfortable clothes and shoes with good support.  Drink plenty of water while you exercise to prevent dehydration or heat stroke. Body water is lost during exercise and must be replaced.  Work out until you breathe faster and your heart beats faster. This information is not intended to replace advice given to you by your health care provider. Make sure you discuss any questions you have with your health  care provider.

## 2020-10-06 ENCOUNTER — Other Ambulatory Visit: Payer: Self-pay | Admitting: Family Medicine

## 2020-10-06 DIAGNOSIS — Z1231 Encounter for screening mammogram for malignant neoplasm of breast: Secondary | ICD-10-CM

## 2020-10-08 ENCOUNTER — Encounter: Payer: Self-pay | Admitting: Family Medicine

## 2020-10-08 LAB — URINE CULTURE
MICRO NUMBER:: 11880830
SPECIMEN QUALITY:: ADEQUATE

## 2020-10-08 LAB — URINALYSIS W MICROSCOPIC + REFLEX CULTURE
Bacteria, UA: NONE SEEN /HPF
Bilirubin Urine: NEGATIVE
Glucose, UA: NEGATIVE
Hgb urine dipstick: NEGATIVE
Hyaline Cast: NONE SEEN /LPF
Nitrites, Initial: NEGATIVE
Protein, ur: NEGATIVE
RBC / HPF: NONE SEEN /HPF (ref 0–2)
Specific Gravity, Urine: 1.016 (ref 1.001–1.035)
pH: 6 (ref 5.0–8.0)

## 2020-10-08 LAB — CULTURE INDICATED

## 2020-10-10 ENCOUNTER — Telehealth: Payer: Self-pay | Admitting: Family Medicine

## 2020-10-10 MED ORDER — NITROFURANTOIN MONOHYD MACRO 100 MG PO CAPS: 100.0000 mg | ORAL_CAPSULE | Freq: Two times a day (BID) | ORAL | 0 refills | Status: DC

## 2020-10-10 NOTE — Addendum Note (Signed)
Addended by: Johnella Moloney on: 10/10/2020 11:21 AM   Modules accepted: Orders

## 2020-10-10 NOTE — Telephone Encounter (Signed)
Pt call and stated she sent a my chart to message to dr. Hassan Rowan and want a anbiotic call in .

## 2020-10-10 NOTE — Telephone Encounter (Signed)
See results note. 

## 2020-10-10 NOTE — Telephone Encounter (Signed)
Abx sent

## 2020-10-12 DIAGNOSIS — L680 Hirsutism: Secondary | ICD-10-CM | POA: Diagnosis not present

## 2020-10-12 DIAGNOSIS — E039 Hypothyroidism, unspecified: Secondary | ICD-10-CM | POA: Diagnosis not present

## 2020-10-18 DIAGNOSIS — Z1211 Encounter for screening for malignant neoplasm of colon: Secondary | ICD-10-CM | POA: Diagnosis not present

## 2020-10-18 LAB — COLOGUARD: Cologuard: NEGATIVE

## 2020-10-24 ENCOUNTER — Other Ambulatory Visit: Payer: Self-pay | Admitting: Family Medicine

## 2020-10-26 ENCOUNTER — Encounter: Payer: Self-pay | Admitting: Family Medicine

## 2020-10-26 LAB — COLOGUARD: COLOGUARD: NEGATIVE

## 2021-02-20 ENCOUNTER — Ambulatory Visit
Admission: RE | Admit: 2021-02-20 | Discharge: 2021-02-20 | Disposition: A | Discharge status: Home / Self Care | Payer: BC Managed Care – PPO | Source: Ambulatory Visit | Attending: Family Medicine | Admitting: Family Medicine

## 2021-02-20 ENCOUNTER — Other Ambulatory Visit: Payer: Self-pay

## 2021-02-20 DIAGNOSIS — Z1231 Encounter for screening mammogram for malignant neoplasm of breast: Secondary | ICD-10-CM

## 2021-04-04 ENCOUNTER — Ambulatory Visit (INDEPENDENT_AMBULATORY_CARE_PROVIDER_SITE_OTHER): Payer: BC Managed Care – PPO

## 2021-04-04 ENCOUNTER — Other Ambulatory Visit: Payer: Self-pay

## 2021-04-04 DIAGNOSIS — Z23 Encounter for immunization: Secondary | ICD-10-CM | POA: Diagnosis not present

## 2021-05-25 ENCOUNTER — Encounter: Payer: Self-pay | Admitting: Family Medicine

## 2021-05-26 ENCOUNTER — Encounter: Payer: Self-pay | Admitting: Family Medicine

## 2021-05-26 ENCOUNTER — Ambulatory Visit (INDEPENDENT_AMBULATORY_CARE_PROVIDER_SITE_OTHER): Payer: BC Managed Care – PPO | Admitting: Family Medicine

## 2021-05-26 VITALS — BP 118/82 | HR 113 | Temp 98.2°F | Ht 63.0 in | Wt 231.7 lb

## 2021-05-26 DIAGNOSIS — J0100 Acute maxillary sinusitis, unspecified: Secondary | ICD-10-CM | POA: Diagnosis not present

## 2021-05-26 MED ORDER — LEVOFLOXACIN 500 MG PO TABS: 500.0000 mg | ORAL_TABLET | Freq: Every day | ORAL | 0 refills | Status: DC

## 2021-05-26 NOTE — Progress Notes (Signed)
Khloee Garza DOB: 1970/12/22 Encounter date: 05/26/2021  This is a 50 y.o. female who presents with Chief Complaint  Patient presents with   Sinus Problem    Patient complains of facial pain, teeth pain and green nasal drainage x8 days, tried Mucinex D and sinus rinses with no relief   Sore Throat    X8 days, denies recent Covid testing    History of present illness:  Day 8-9 and still feels like it is getting worse.she is getting drainage with cough at this point. Started with sore throat which got really bad. Tonsils/glands so swollen it was hard to see. Then drainage improved, but now face pain, teeth pain, green drainage. No fevers. Not much relief with sinus rinses, mucinex.   Allergies  Allergen Reactions   Codeine Nausea Only   Doxycycline     Swelling of the throat per patient   Penicillins     REACTION: rash   Current Meds  Medication Sig   Ascorbic Acid (VITAMIN C PO) Take by mouth.   atorvastatin (LIPITOR) 20 MG tablet TAKE 1 TABLET DAILY   levocetirizine (XYZAL) 5 MG tablet TAKE 1 TABLET EVERY EVENING   mometasone (NASONEX) 50 MCG/ACT nasal spray Place 2 sprays into the nose daily.   Multiple Vitamin (MULTIVITAMINS PO) Take by mouth.   sertraline (ZOLOFT) 100 MG tablet TAKE 1 TABLET DAILY   spironolactone (ALDACTONE) 25 MG tablet Take 25 mg by mouth daily.   SYNTHROID 75 MCG tablet     Review of Systems  Constitutional:  Negative for chills and fever.  HENT:  Positive for congestion, postnasal drip, sinus pressure, sinus pain and sore throat. Negative for ear pain.   Respiratory:  Negative for cough, shortness of breath and wheezing.   Cardiovascular:  Negative for chest pain.   Objective:  BP 118/82 (BP Location: Left Arm, Patient Position: Sitting, Cuff Size: Large)    Pulse (!) 113    Temp 98.2 F (36.8 C) (Oral)    Ht 5\' 3"  (1.6 m)    Wt 231 lb 11.2 oz (105.1 kg)    LMP 03/28/2021 (Approximate)    SpO2 95%    BMI 41.04 kg/m   Weight: 231 lb  11.2 oz (105.1 kg)   BP Readings from Last 3 Encounters:  05/26/21 118/82  10/05/20 110/82  07/07/18 112/76   Wt Readings from Last 3 Encounters:  05/26/21 231 lb 11.2 oz (105.1 kg)  10/05/20 217 lb 4.8 oz (98.6 kg)  07/07/18 214 lb 6.4 oz (97.3 kg)    Physical Exam Constitutional:      General: She is not in acute distress.    Appearance: She is well-developed.  HENT:     Head: Normocephalic and atraumatic.     Right Ear: Tympanic membrane, ear canal and external ear normal.     Left Ear: Tympanic membrane, ear canal and external ear normal.     Nose: Mucosal edema present.     Right Sinus: Maxillary sinus tenderness and frontal sinus tenderness present.     Left Sinus: Maxillary sinus tenderness and frontal sinus tenderness present.     Mouth/Throat:     Pharynx: Uvula midline. Posterior oropharyngeal erythema present.  Cardiovascular:     Rate and Rhythm: Normal rate and regular rhythm.  Pulmonary:     Effort: Pulmonary effort is normal. No respiratory distress.     Breath sounds: Normal breath sounds. No wheezing, rhonchi or rales.    Assessment/Plan   1. Acute maxillary  sinusitis, recurrence not specified With her PCN allergy (never tried amox/cephalosporin),doxy allergy, and z pack not ideal for sinus treatment, we will tx with levaquin. She has done well with this in the past.   Continue with mucinex. Let us know if any worsening of sx or if not improved after tx.   Return if symptoms worsen or fail to improve.    Theodis Shove, MD

## 2021-06-02 ENCOUNTER — Encounter: Payer: Self-pay | Admitting: Family Medicine

## 2021-06-05 ENCOUNTER — Other Ambulatory Visit: Payer: Self-pay | Admitting: Family Medicine

## 2021-06-05 MED ORDER — LEVOFLOXACIN 500 MG PO TABS: 500.0000 mg | ORAL_TABLET | Freq: Every day | ORAL | 0 refills | Status: AC

## 2021-07-31 ENCOUNTER — Encounter: Payer: Self-pay | Admitting: Family Medicine

## 2021-07-31 DIAGNOSIS — E039 Hypothyroidism, unspecified: Secondary | ICD-10-CM

## 2021-07-31 DIAGNOSIS — E785 Hyperlipidemia, unspecified: Secondary | ICD-10-CM

## 2021-07-31 DIAGNOSIS — R739 Hyperglycemia, unspecified: Secondary | ICD-10-CM

## 2021-09-27 ENCOUNTER — Other Ambulatory Visit (INDEPENDENT_AMBULATORY_CARE_PROVIDER_SITE_OTHER): Payer: BC Managed Care – PPO

## 2021-09-27 DIAGNOSIS — E039 Hypothyroidism, unspecified: Secondary | ICD-10-CM

## 2021-09-27 DIAGNOSIS — E785 Hyperlipidemia, unspecified: Secondary | ICD-10-CM

## 2021-09-27 DIAGNOSIS — R739 Hyperglycemia, unspecified: Secondary | ICD-10-CM

## 2021-09-27 LAB — COMPREHENSIVE METABOLIC PANEL
ALT: 20 U/L (ref 0–35)
AST: 22 U/L (ref 0–37)
Albumin: 4.7 g/dL (ref 3.5–5.2)
Alkaline Phosphatase: 83 U/L (ref 39–117)
BUN: 17 mg/dL (ref 6–23)
CO2: 24 mEq/L (ref 19–32)
Calcium: 10 mg/dL (ref 8.4–10.5)
Chloride: 101 mEq/L (ref 96–112)
Creatinine, Ser: 0.85 mg/dL (ref 0.40–1.20)
GFR: 79.31 mL/min (ref >60.00)
Glucose, Bld: 103 mg/dL — ABNORMAL HIGH (ref 70–99)
Potassium: 4.1 mEq/L (ref 3.5–5.1)
Sodium: 136 mEq/L (ref 135–145)
Total Bilirubin: 0.3 mg/dL (ref 0.2–1.2)
Total Protein: 8 g/dL (ref 6.0–8.3)

## 2021-09-27 LAB — TSH: TSH: 1.53 u[IU]/mL (ref 0.35–5.50)

## 2021-09-27 LAB — CBC WITH DIFFERENTIAL/PLATELET
Basophils Absolute: 0 10*3/uL (ref 0.0–0.1)
Basophils Relative: 0.4 % (ref 0.0–3.0)
Eosinophils Absolute: 0.2 10*3/uL (ref 0.0–0.7)
Eosinophils Relative: 4 % (ref 0.0–5.0)
HCT: 44.3 % (ref 36.0–46.0)
Hemoglobin: 14.6 g/dL (ref 12.0–15.0)
Lymphocytes Relative: 19.2 % (ref 12.0–46.0)
Lymphs Abs: 1.2 10*3/uL (ref 0.7–4.0)
MCHC: 32.9 g/dL (ref 30.0–36.0)
MCV: 92 fl (ref 78.0–100.0)
Monocytes Absolute: 0.4 10*3/uL (ref 0.1–1.0)
Monocytes Relative: 7.3 % (ref 3.0–12.0)
Neutro Abs: 4.2 10*3/uL (ref 1.4–7.7)
Neutrophils Relative %: 69.1 % (ref 43.0–77.0)
Platelets: 363 10*3/uL (ref 150.0–400.0)
RBC: 4.81 Mil/uL (ref 3.87–5.11)
RDW: 12.4 % (ref 11.5–15.5)
WBC: 6.1 10*3/uL (ref 4.0–10.5)

## 2021-09-27 LAB — LIPID PANEL
Cholesterol: 205 mg/dL — ABNORMAL HIGH (ref 0–200)
HDL: 54.7 mg/dL (ref >39.00)
LDL Cholesterol: 112 mg/dL — ABNORMAL HIGH (ref 0–99)
NonHDL: 150.42
Total CHOL/HDL Ratio: 4
Triglycerides: 190 mg/dL — ABNORMAL HIGH (ref 0.0–149.0)
VLDL: 38 mg/dL (ref 0.0–40.0)

## 2021-09-27 LAB — HEMOGLOBIN A1C: Hgb A1c MFr Bld: 6 % (ref 4.6–6.5)

## 2021-09-30 ENCOUNTER — Other Ambulatory Visit: Payer: Self-pay | Admitting: Family Medicine

## 2021-10-04 ENCOUNTER — Encounter: Payer: Self-pay | Admitting: Family Medicine

## 2021-10-04 ENCOUNTER — Ambulatory Visit (INDEPENDENT_AMBULATORY_CARE_PROVIDER_SITE_OTHER): Payer: BC Managed Care – PPO | Admitting: Family Medicine

## 2021-10-04 VITALS — BP 124/88 | HR 96 | Temp 97.7°F | Ht 63.0 in | Wt 221.5 lb

## 2021-10-04 DIAGNOSIS — E785 Hyperlipidemia, unspecified: Secondary | ICD-10-CM

## 2021-10-04 DIAGNOSIS — E039 Hypothyroidism, unspecified: Secondary | ICD-10-CM | POA: Diagnosis not present

## 2021-10-04 DIAGNOSIS — Z6839 Body mass index (BMI) 39.0-39.9, adult: Secondary | ICD-10-CM

## 2021-10-04 DIAGNOSIS — Z0000 Encounter for general adult medical examination without abnormal findings: Secondary | ICD-10-CM

## 2021-10-04 DIAGNOSIS — R739 Hyperglycemia, unspecified: Secondary | ICD-10-CM

## 2021-10-04 MED ORDER — TIRZEPATIDE 2.5 MG/0.5ML ~~LOC~~ SOAJ
2.5000 mg | SUBCUTANEOUS | 2 refills | Status: DC
Start: 2021-10-04 — End: 2021-10-13

## 2021-10-04 NOTE — Progress Notes (Signed)
?Andrea Delgado ?DOB: 11-13-1970 ?Encounter date: 10/04/2021 ? ?This is a 51 y.o. female who presents with ?Chief Complaint  ?Patient presents with  ? Annual Exam  ? ? ?History of present illness: ? ?Last visit with me was 1 year ago for complete physical. ? ?She knows she needs to move more and make healthier eating choices. She has been eating a lot of chicken, but is eating skin with it.  ? ?She was interested initially in trying wegovy, then mounjaro. She would like to do something to help with sugar, weight loss. She has struggled with weight since her late 20's. Has done nutrisystem (didn't tolerate this), jenny craig (lost most weight with this - lost over 100lbs and was exercising regularly - that was about 10 years ago). Went to bariatric clinic. Tried adipex which did help, but she thinks it was motivation of her starting to lose weight. Tends to be emotional eater; will gain after loss. 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 Frequently working 8-10 hours a day so hard to fit in activity. Additionally stress of managing her parents medical needs (primary caregiver for mother after liver transplant). Then dad with dementia, then dad passed, sister passed. Just all a lot to manage with also managing herself. Cost of jenny craig prohibited her from trying again. Once she is able to start losing, she really gets motivated.  ?  ?Hypothyroid: synthroid daily.  ?Hyperlipidemia: lipitor 20mg  daily ?Depression: mood has been stable on zoloft 100mg  daily. Some ups and downs, but nothing that has been concerning to her. ?Allergies: does get occasional flares with allergies. Generally she is well controlled with xyzal and mucinex.  ? ? ?Last mammogram was 02/24/2021 and was normal. ?Cologuard was negative- 09/2020 ?Last pap was through gyn in 2018 ? ? ?Allergies  ?Allergen Reactions  ? Codeine Nausea Only  ? Doxycycline   ?  Swelling of the throat per patient  ? Penicillins   ?  REACTION: rash   ? ?Current Meds  ?Medication Sig  ? Ascorbic Acid (VITAMIN C PO) Take by mouth.  ? atorvastatin (LIPITOR) 20 MG tablet TAKE 1 TABLET DAILY  ? levocetirizine (XYZAL) 5 MG tablet TAKE 1 TABLET EVERY EVENING  ? mometasone (NASONEX) 50 MCG/ACT nasal spray Place 2 sprays into the nose daily.  ? Multiple Vitamin (MULTIVITAMINS PO) Take by mouth.  ? sertraline (ZOLOFT) 100 MG tablet TAKE 1 TABLET DAILY  ? spironolactone (ALDACTONE) 25 MG tablet Take 25 mg by mouth daily.  ? SYNTHROID 75 MCG tablet   ? tirzepatide (MOUNJARO) 2.5 MG/0.5ML Pen Inject 2.5 mg into the skin once a week.  ? ? ?Review of Systems  ?Constitutional:  Negative for activity change, appetite change, chills, fatigue, fever and unexpected weight change.  ?HENT:  Negative for congestion, ear pain, hearing loss, sinus pressure, sinus pain, sore throat and trouble swallowing.   ?Eyes:  Negative for pain and visual disturbance.  ?Respiratory:  Negative for cough, chest tightness, shortness of breath and wheezing.   ?Cardiovascular:  Negative for chest pain, palpitations and leg swelling.  ?Gastrointestinal:  Negative for abdominal pain, blood in stool, constipation, diarrhea, nausea and vomiting.  ?Genitourinary:  Negative for difficulty urinating and menstrual problem.  ?Musculoskeletal:  Negative for arthralgias and back pain.  ?Skin:  Negative for rash.  ?Neurological:  Negative for dizziness, weakness, numbness and headaches.  ?Hematological:  Negative for adenopathy. Does not bruise/bleed easily.  ?Psychiatric/Behavioral:  Negative for sleep disturbance and suicidal  ideas. The patient is not nervous/anxious.   ? ?Objective: ? ?BP 124/88 (BP Location: Left Arm, Patient Position: Sitting, Cuff Size: Large)   Pulse 96   Temp 97.7 ?F (36.5 ?C) (Oral)   Ht 5\' 3"  (1.6 m)   Wt 221 lb 8 oz (100.5 kg)   SpO2 99%   BMI 39.24 kg/m?   Weight: 221 lb 8 oz (100.5 kg)  ? ?BP Readings from Last 3 Encounters:  ?10/04/21 124/88  ?05/26/21 118/82  ?10/05/20 110/82   ? ?Wt Readings from Last 3 Encounters:  ?10/04/21 221 lb 8 oz (100.5 kg)  ?05/26/21 231 lb 11.2 oz (105.1 kg)  ?10/05/20 217 lb 4.8 oz (98.6 kg)  ? ? ?Physical Exam ?Constitutional:   ?   General: She is not in acute distress. ?   Appearance: She is well-developed.  ?HENT:  ?   Head: Normocephalic and atraumatic.  ?   Right Ear: External ear normal.  ?   Left Ear: External ear normal.  ?   Mouth/Throat:  ?   Pharynx: No oropharyngeal exudate.  ?Eyes:  ?   Conjunctiva/sclera: Conjunctivae normal.  ?   Pupils: Pupils are equal, round, and reactive to light.  ?Neck:  ?   Thyroid: No thyromegaly.  ?Cardiovascular:  ?   Rate and Rhythm: Normal rate and regular rhythm.  ?   Heart sounds: Normal heart sounds. No murmur heard. ?  No friction rub. No gallop.  ?Pulmonary:  ?   Effort: Pulmonary effort is normal.  ?   Breath sounds: Normal breath sounds.  ?Abdominal:  ?   General: Bowel sounds are normal. There is no distension.  ?   Palpations: Abdomen is soft. There is no mass.  ?   Tenderness: There is no abdominal tenderness. There is no guarding.  ?   Hernia: No hernia is present.  ?Musculoskeletal:     ?   General: No tenderness or deformity. Normal range of motion.  ?   Cervical back: Normal range of motion and neck supple.  ?Lymphadenopathy:  ?   Cervical: No cervical adenopathy.  ?Skin: ?   General: Skin is warm and dry.  ?   Findings: No rash.  ?Neurological:  ?   Mental Status: She is alert and oriented to person, place, and time.  ?   Deep Tendon Reflexes: Reflexes normal.  ?   Reflex Scores: ?     Tricep reflexes are 2+ on the right side and 2+ on the left side. ?     Bicep reflexes are 2+ on the right side and 2+ on the left side. ?     Brachioradialis reflexes are 2+ on the right side and 2+ on the left side. ?     Patellar reflexes are 2+ on the right side and 2+ on the left side. ?Psychiatric:     ?   Speech: Speech normal.     ?   Behavior: Behavior normal.     ?   Thought Content: Thought content normal.   ? ? ?Assessment/Plan ? ? ?1. Preventative health care ?Work on regular exercise and healthy eating.  ? ?2. Hypothyroidism, unspecified type ?Synthroid 75mcg daily. Follows with [facility] regularly.  ? ?3. Hyperlipidemia, unspecified hyperlipidemia type ?Lipidor 20mg  daily; has been stable. She is working on [place] eating.  ?- tirzepatide [facility](MOUNJARO) 2.5 MG/0.5ML Pen; Inject 2.5 mg into the skin once a week.  Dispense: 2 mL; Refill: 2 ? ?4. Class 2 severe obesity with serious  comorbidity and body mass index (BMI) of 39.0 to 39.9 in adult, unspecified obesity type (HCC) ?Work on regular exercise, healthier eating.  ?- tirzepatide [facility]) 2.5 MG/0.5ML Pen; Inject 2.5 mg into the skin once a week.  Dispense: 2 mL; Refill: 2 ? ?5. Hyperglycemia ?- tirzepatide (MOUNJARO) 2.5 MG/0.5ML Pen; Inject 2.5 mg into the skin once a week.  Dispense: 2 mL; Refill: 2 ? ? ?She will consider shingles vaccine.  ? ?Return in about 3 months (around 01/04/2022) for establish care with new provider. ? ? ? ? ?Theodis Shove, MD ?

## 2021-10-04 NOTE — Patient Instructions (Addendum)
Get home wrist cuff and start to monitor. Let us know if your home blood pressures are regularly over 0000000 systolic or 85 diastolic.  ? ?Focus on portion control. There are also a lot of helpful meal planning ideas online- look at ADA (diabetic association) website - 1500-1800 calorie diet plan.  ?

## 2021-10-05 ENCOUNTER — Other Ambulatory Visit: Payer: Self-pay | Admitting: Family Medicine

## 2021-10-05 ENCOUNTER — Telehealth: Payer: Self-pay | Admitting: *Deleted

## 2021-10-05 DIAGNOSIS — Z1231 Encounter for screening mammogram for malignant neoplasm of breast: Secondary | ICD-10-CM

## 2021-10-05 DIAGNOSIS — E039 Hypothyroidism, unspecified: Secondary | ICD-10-CM | POA: Diagnosis not present

## 2021-10-05 NOTE — Telephone Encounter (Signed)
Kristopher Oppenheim faxed a request for a prior auth for [facility] 2.5mg .  PA was sent to [email or web address]-Key: [id number] pending review by insurance. ?

## 2021-10-09 ENCOUNTER — Encounter: Payer: Self-pay | Admitting: Family Medicine

## 2021-10-09 NOTE — Telephone Encounter (Signed)
Fax received from [facility] stating the request was denied and given to PCP. ?

## 2021-10-09 NOTE — Telephone Encounter (Signed)
It doesn't look to me like any weight loss meds are covered on her formulary and reading through this denial, the alternatives suggested (although in same class) are only indicated for diabetes and not weight loss, so they will not cover these. I would suggest she check with insurance to see if they cover any weight loss meds on her plan: wegovy, saxenda would be preferred (but I didn't see any listed with my search, although I was only looking at what I could find on internet).  ?

## 2021-10-09 NOTE — Telephone Encounter (Signed)
Patient informed of the message below.

## 2021-10-12 NOTE — Telephone Encounter (Signed)
Patient called to follow up on message being sent to Dr.Koberlein. Patient wants to know if Dr.Koberlein approves of the ozempic. I let patient know that the message had been sent back, it was being worked on and when new information came about, she would be updated. Patient is just worried with Dr.Koberlein leaving next week, which I told her I understood and repeated previous statement.        FYI

## 2021-10-13 MED ORDER — WEGOVY 0.25 MG/0.5ML ~~LOC~~ SOAJ: 0.2500 mg | SUBCUTANEOUS | 2 refills | Status: DC

## 2021-10-17 NOTE — Telephone Encounter (Signed)
Noted  

## 2021-10-17 NOTE — Telephone Encounter (Signed)
Fax received stating the request was denied and given to PCP for review. 

## 2021-10-18 NOTE — Telephone Encounter (Signed)
Pt is calling and would like md to view her message she sent to md. Pt is asking if md could take care of this today

## 2021-10-19 ENCOUNTER — Telehealth: Payer: Self-pay | Admitting: Family Medicine

## 2021-10-19 NOTE — Telephone Encounter (Signed)
Pt calling again to check on progress

## 2021-10-20 NOTE — Telephone Encounter (Signed)
Message sent to PCP as questions for the prior auth ask if the patient is "currently" enrolled in an exercise program and message was forwarded to the patient stated we have not received a fax with a decision from the insurance company.

## 2021-10-26 ENCOUNTER — Telehealth: Payer: Self-pay | Admitting: Family Medicine

## 2021-10-26 NOTE — Telephone Encounter (Signed)
Noted  

## 2021-10-26 NOTE — Telephone Encounter (Signed)
See My chart message

## 2021-10-26 NOTE — Telephone Encounter (Signed)
Pt is calling and PA for wegovy needs to be resubmitted

## 2021-11-03 ENCOUNTER — Telehealth: Payer: Self-pay | Admitting: Family Medicine

## 2021-11-03 NOTE — Telephone Encounter (Signed)
[place] [facility] called with questions regarding appeal for prescription wegovy    Good callback number is [phone] ext [phone]   [place] will also be sending it over in a fax    Please advise

## 2021-11-06 NOTE — Telephone Encounter (Signed)
[facility] and is she aware. We received the info re: Wegovy

## 2021-11-07 NOTE — Telephone Encounter (Signed)
Fax received from [facility].  One question on the form asking will the requested drug be used with a reduced calorie diet and increased physical activity for chronic wt management in an adult was answered "yes" and faxed to [phone].  Form was sent to be scanned.

## 2021-11-08 DIAGNOSIS — H02834 Dermatochalasis of left upper eyelid: Secondary | ICD-10-CM | POA: Diagnosis not present

## 2021-11-08 DIAGNOSIS — H02831 Dermatochalasis of right upper eyelid: Secondary | ICD-10-CM | POA: Diagnosis not present

## 2021-11-08 DIAGNOSIS — H25813 Combined forms of age-related cataract, bilateral: Secondary | ICD-10-CM | POA: Diagnosis not present

## 2021-11-08 DIAGNOSIS — H524 Presbyopia: Secondary | ICD-10-CM | POA: Diagnosis not present

## 2021-11-15 NOTE — Telephone Encounter (Signed)
error 

## 2021-12-06 DIAGNOSIS — E039 Hypothyroidism, unspecified: Secondary | ICD-10-CM | POA: Diagnosis not present

## 2021-12-06 DIAGNOSIS — L680 Hirsutism: Secondary | ICD-10-CM | POA: Diagnosis not present

## 2021-12-11 ENCOUNTER — Other Ambulatory Visit: Payer: Self-pay | Admitting: *Deleted

## 2021-12-11 MED ORDER — SERTRALINE HCL 100 MG PO TABS: 100.0000 mg | ORAL_TABLET | Freq: Every day | ORAL | 0 refills | Status: DC

## 2022-02-18 ENCOUNTER — Encounter: Payer: Self-pay | Admitting: Family Medicine

## 2022-02-19 MED ORDER — ATORVASTATIN CALCIUM 20 MG PO TABS: 20.0000 mg | ORAL_TABLET | Freq: Every day | ORAL | 0 refills | Status: DC

## 2022-02-19 NOTE — Telephone Encounter (Signed)
Ok to refill the atorvastatin

## 2022-02-21 ENCOUNTER — Ambulatory Visit
Admission: RE | Admit: 2022-02-21 | Discharge: 2022-02-21 | Disposition: A | Discharge status: Home / Self Care | Payer: BC Managed Care – PPO | Source: Ambulatory Visit | Attending: Family Medicine | Admitting: Family Medicine

## 2022-02-21 DIAGNOSIS — Z1231 Encounter for screening mammogram for malignant neoplasm of breast: Secondary | ICD-10-CM

## 2022-02-23 ENCOUNTER — Encounter: Payer: Self-pay | Admitting: Family Medicine

## 2022-02-23 DIAGNOSIS — F3342 Major depressive disorder, recurrent, in full remission: Secondary | ICD-10-CM

## 2022-02-28 MED ORDER — SERTRALINE HCL 100 MG PO TABS: 100.0000 mg | ORAL_TABLET | Freq: Every day | ORAL | 0 refills | Status: DC

## 2022-03-03 ENCOUNTER — Encounter: Payer: Self-pay | Admitting: Family Medicine

## 2022-03-05 NOTE — Telephone Encounter (Signed)
Left a message for patient to return my call. 

## 2022-04-10 ENCOUNTER — Encounter: Payer: BC Managed Care – PPO | Admitting: Family Medicine

## 2022-05-15 ENCOUNTER — Ambulatory Visit (INDEPENDENT_AMBULATORY_CARE_PROVIDER_SITE_OTHER): Payer: BC Managed Care – PPO | Admitting: Family Medicine

## 2022-05-15 ENCOUNTER — Encounter: Payer: Self-pay | Admitting: Family Medicine

## 2022-05-15 DIAGNOSIS — F3342 Major depressive disorder, recurrent, in full remission: Secondary | ICD-10-CM | POA: Diagnosis not present

## 2022-05-15 DIAGNOSIS — R7303 Prediabetes: Secondary | ICD-10-CM | POA: Diagnosis not present

## 2022-05-15 DIAGNOSIS — E782 Mixed hyperlipidemia: Secondary | ICD-10-CM

## 2022-05-15 MED ORDER — SERTRALINE HCL 100 MG PO TABS: 100.0000 mg | ORAL_TABLET | Freq: Every day | ORAL | 3 refills | Status: DC

## 2022-05-15 MED ORDER — ATORVASTATIN CALCIUM 20 MG PO TABS: 20.0000 mg | ORAL_TABLET | Freq: Every day | ORAL | 3 refills | Status: DC

## 2022-05-15 MED ORDER — ZEPBOUND 2.5 MG/0.5ML ~~LOC~~ SOAJ: 2.5000 mg | SUBCUTANEOUS | 3 refills | Status: DC

## 2022-05-15 NOTE — Assessment & Plan Note (Signed)
Last A1C was 6.0, we discussed the low carb diet and handout was given. Will recheck this in May at her annual physical.

## 2022-05-15 NOTE — Progress Notes (Signed)
Established Patient Office Visit  Subjective   Patient ID: Andrea Delgado, female    DOB: 02-04-1971  Age: 51 y.o. MRN: [id number]  Chief Complaint  Patient presents with   Establish Care    Patient is here for transition of care visit.   Obesity-- patient states that the wegovy was never available to take that medication. States that her insurance did approve the wegovy however drug shortages prevented the patient from being able to get the medication. Has done nutrisystem (didn't tolerate this), jenny craig (lost most weight with this - lost over 100lbs and was exercising regularly - that was about 10 years ago). Went to bariatric clinic. Tried adipex which did help, but she thinks it was motivation of her starting to lose weight. Tends to be emotional eater; will gain after loss. 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  Hypothyroidism -- pt reports that see an endocrinologist regularly. States that she is stable on her current dose of levothyroxine 75 mcg daily.   Depression/ anxiety-- patient is currently on sertraline 100 mg daily, she reports that her mother passed away recently and she is going through the greiving process.  States that she feels she has good control of her anxiety/depression symptoms, she denies any side effects to the medication.   Current Outpatient Medications  Medication Instructions   Ascorbic Acid (VITAMIN C PO) Oral   atorvastatin (LIPITOR) 20 mg, Oral, Daily   levocetirizine (XYZAL) 5 MG tablet TAKE 1 TABLET EVERY EVENING   mometasone (NASONEX) 50 MCG/ACT nasal spray 2 sprays, Nasal, Daily   Multiple Vitamin (MULTIVITAMINS PO) Oral   sertraline (ZOLOFT) 100 mg, Oral, Daily   spironolactone (ALDACTONE) 25 mg, Oral, Daily,     SYNTHROID 75 MCG tablet No dose, route, or frequency recorded.   Zepbound 2.5 mg, Subcutaneous, Weekly    Patient Active Problem List   Diagnosis Date Noted    Prediabetes 05/15/2022   Hyperglycemia 01/16/2019   Recurrent major depressive disorder, in full remission (HCC) 06/17/2017   Hypothyroidism 04/27/2016   Hirsutism 10/08/2011   Eczema 10/08/2011   Morbid obesity (HCC) 08/18/2009   Hyperlipemia 05/05/2009      Review of Systems  All other systems reviewed and are negative.     Objective:     BP 124/88 (BP Location: Left Arm, Patient Position: Sitting, Cuff Size: Large)   Pulse 96   Temp 98.2 F (36.8 C) (Oral)   Ht 5\' 3"  (1.6 m)   Wt 230 lb 6.4 oz (104.5 kg)   SpO2 98%   BMI 40.81 kg/m    Physical Exam Vitals reviewed.  Constitutional:      Appearance: Normal appearance. She is well-groomed. She is morbidly obese.  Eyes:     Conjunctiva/sclera: Conjunctivae normal.  Neck:     Thyroid: No thyromegaly.  Cardiovascular:     Rate and Rhythm: Normal rate and regular rhythm.     Pulses: Normal pulses.     Heart sounds: S1 normal and S2 normal.  Pulmonary:     Effort: Pulmonary effort is normal.     Breath sounds: Normal breath sounds and air entry.  Musculoskeletal:     Right lower leg: No edema.     Left lower leg: No edema.  Neurological:     Mental Status: She is alert and oriented to person, place, and time. Mental status is at baseline.  Gait: Gait is intact.  Psychiatric:        Mood and Affect: Mood and affect normal.        Speech: Speech normal.        Behavior: Behavior normal.        Judgment: Judgment normal.      No results found for any visits on 05/15/22.    The 10-year ASCVD risk score (Arnett DK, et al., 2019) is: 2%    Assessment & Plan:   Problem List Items Addressed This Visit       Unprioritized   Hyperlipemia    Continue lipitor 20 mg daily, refilled medication, she denies any muscle cramps or fatigue today. Will need new lipid panel in May.       Relevant Medications   atorvastatin (LIPITOR) 20 MG tablet   Morbid obesity (HCC) - Primary (Chronic)    I have had an  extensive 30 minute conversation today with the patient about healthy eating habits, exercise, calorie and carb goals for sustainable and successful weight loss. I gave the patient caloric and protein daily intake values as well as described the importance of increasing fiber and water intake. I discussed weight loss medications that could be used in the treatment of this patient. Handouts on low carb eating were given to the patient.    I will send an rx for Zepbound 2.5 mg once weekly to the pharmacy -- I advised that there is a coupon on the website to help cover the cost of the medication. I will see the patient back in May for her annual physical.       Relevant Medications   Tirzepatide-Weight Management (ZEPBOUND) 2.5 MG/0.5ML SOAJ   Recurrent major depressive disorder, in full remission (HCC)    Sx stable per the patient's report. Will continue sertraline 100 mg daily, pt is currently grieving the loss of her mother in October, she reports she is managing her sx well at home.       Relevant Medications   sertraline (ZOLOFT) 100 MG tablet   Prediabetes (Chronic)    Last A1C was 6.0, we discussed the low carb diet and handout was given. Will recheck this in May at her annual physical.      Relevant Medications   Tirzepatide-Weight Management (ZEPBOUND) 2.5 MG/0.5ML SOAJ    Return in about 5 months (around 10/14/2022) for Annual Physical Exam.    Karie Georges, MD

## 2022-05-15 NOTE — Assessment & Plan Note (Signed)
Sx stable per the patient's report. Will continue sertraline 100 mg daily, pt is currently grieving the loss of her mother in October, she reports she is managing her sx well at home.

## 2022-05-15 NOTE — Assessment & Plan Note (Signed)
I have had an extensive 30 minute conversation today with the patient about healthy eating habits, exercise, calorie and carb goals for sustainable and successful weight loss. I gave the patient caloric and protein daily intake values as well as described the importance of increasing fiber and water intake. I discussed weight loss medications that could be used in the treatment of this patient. Handouts on low carb eating were given to the patient.    I will send an rx for Zepbound 2.5 mg once weekly to the pharmacy -- I advised that there is a coupon on the website to help cover the cost of the medication. I will see the patient back in May for her annual physical.

## 2022-05-15 NOTE — Patient Instructions (Addendum)
Total calorie -- 1600-1800 per day  Total protein- at least 100 gram per day  Total fiber-- at least 20 -25 grams daily  Total carb -- less than 90 grams per day

## 2022-05-15 NOTE — Assessment & Plan Note (Signed)
Continue lipitor 20 mg daily, refilled medication, she denies any muscle cramps or fatigue today. Will need new lipid panel in May.

## 2022-05-16 ENCOUNTER — Other Ambulatory Visit (HOSPITAL_COMMUNITY): Payer: Self-pay

## 2022-05-16 ENCOUNTER — Telehealth: Payer: Self-pay

## 2022-05-16 NOTE — Telephone Encounter (Signed)
Pharmacy Patient Advocate Encounter   Received notification from Karin Golden that prior authorization for Zepbound 2.5MG /0.5ML pen-injectors is required/requested.  Per Test Claim: MUST USE ORLISTAT, QSYMIA, SAXENDA, WEGOVY OR MED NEC PA ONLY    PA submitted on 05/17/27 to (ins) [employer] via CoverMyMeds Key [id number]   Status is pending

## 2022-05-17 NOTE — Telephone Encounter (Signed)
Prior Berkley Harvey has been denied. Documented in separate telephone encounter.

## 2022-05-17 NOTE — Telephone Encounter (Signed)
Is there anything I can do to get the medication approved?? She was approved to get the [facility] however it has been on back order for several months now.Marland KitchenMarland Kitchen

## 2022-05-17 NOTE — Telephone Encounter (Signed)
Pharmacy Patient Advocate Encounter  Received notification from [facility] that the request for prior authorization for Zepbound 2.5 MG/0.75ml has been denied due to .    How would you like to proceed?  Please be advised appeals may take up to 5 business days to be submitted as pharmacist prepares necessary documentation.  Thank you!

## 2022-05-17 NOTE — Telephone Encounter (Signed)
Thanks for the info! I will try to see if she will agree to one of the other meds for now. Then if she fails that then she would be a candidate for the zpebound.

## 2022-05-18 MED ORDER — PHENTERMINE HCL 15 MG PO CAPS: 15.0000 mg | ORAL_CAPSULE | ORAL | 0 refills | Status: DC

## 2022-06-08 MED ORDER — PHENTERMINE-TOPIRAMATE ER 11.25-69 MG PO CP24: 1.0000 | ORAL_CAPSULE | Freq: Every day | ORAL | 0 refills | Status: DC

## 2022-06-08 MED ORDER — PHENTERMINE-TOPIRAMATE ER 3.75-23 MG PO CP24: 1.0000 | ORAL_CAPSULE | Freq: Every day | ORAL | 0 refills | Status: DC

## 2022-06-08 MED ORDER — PHENTERMINE-TOPIRAMATE ER 7.5-46 MG PO CP24: 1.0000 | ORAL_CAPSULE | Freq: Every day | ORAL | 0 refills | Status: DC

## 2022-06-15 MED ORDER — SEMAGLUTIDE-WEIGHT MANAGEMENT 0.5 MG/0.5ML ~~LOC~~ SOAJ: 0.5000 mg | SUBCUTANEOUS | 0 refills | Status: DC

## 2022-06-15 MED ORDER — SEMAGLUTIDE-WEIGHT MANAGEMENT 0.25 MG/0.5ML ~~LOC~~ SOAJ: 0.2500 mg | SUBCUTANEOUS | 0 refills | Status: AC

## 2022-06-15 MED ORDER — SEMAGLUTIDE-WEIGHT MANAGEMENT 1.7 MG/0.75ML ~~LOC~~ SOAJ: 1.7000 mg | SUBCUTANEOUS | 0 refills | Status: DC

## 2022-06-15 MED ORDER — SEMAGLUTIDE-WEIGHT MANAGEMENT 2.4 MG/0.75ML ~~LOC~~ SOAJ: 2.4000 mg | SUBCUTANEOUS | 5 refills | Status: DC

## 2022-06-15 MED ORDER — SEMAGLUTIDE-WEIGHT MANAGEMENT 1 MG/0.5ML ~~LOC~~ SOAJ: 1.0000 mg | SUBCUTANEOUS | 0 refills | Status: DC

## 2022-06-16 ENCOUNTER — Other Ambulatory Visit: Payer: Self-pay | Admitting: Family Medicine

## 2022-06-22 NOTE — Telephone Encounter (Signed)
It is a prior auth? Those go to the prior [place] folks I think -- I haven't received anything

## 2022-06-25 ENCOUNTER — Other Ambulatory Visit (HOSPITAL_COMMUNITY): Payer: Self-pay

## 2022-06-26 ENCOUNTER — Encounter: Payer: Self-pay | Admitting: Family Medicine

## 2022-06-27 ENCOUNTER — Other Ambulatory Visit (HOSPITAL_COMMUNITY): Payer: Self-pay

## 2022-06-27 NOTE — Telephone Encounter (Signed)
Patient Advocate Encounter   Received notification from [facility] that prior authorization for [facility] is required.   PA submitted on 06/27/2022 Key [id number] Status is pending

## 2022-06-27 NOTE — Telephone Encounter (Signed)
Can we check with the prior auth people to see if the prior auth was done?

## 2022-06-28 ENCOUNTER — Telehealth: Payer: Self-pay

## 2022-06-28 ENCOUNTER — Other Ambulatory Visit (HOSPITAL_COMMUNITY): Payer: Self-pay

## 2022-06-28 NOTE — Telephone Encounter (Signed)
PA has been approved.

## 2022-06-28 NOTE — Telephone Encounter (Signed)
Pharmacy Patient Advocate Encounter  Prior Authorization for  Wegovy (semaglutide injection) 0.25 mg/0.5 mL. has been approved.    PA# [id number] Effective dates: 06/27/2022 through 01/26/2023  Spoke with Pharmacy to process.

## 2022-07-17 ENCOUNTER — Other Ambulatory Visit: Payer: Self-pay | Admitting: Family Medicine

## 2022-07-21 ENCOUNTER — Encounter: Payer: Self-pay | Admitting: Family Medicine

## 2022-07-23 MED ORDER — WEGOVY 0.5 MG/0.5ML ~~LOC~~ SOAJ: 0.5000 mg | SUBCUTANEOUS | 2 refills | Status: DC

## 2022-07-25 ENCOUNTER — Other Ambulatory Visit: Payer: Self-pay | Admitting: Family Medicine

## 2022-07-30 ENCOUNTER — Telehealth: Payer: Self-pay | Admitting: Family Medicine

## 2022-07-30 NOTE — Telephone Encounter (Signed)
Liraglutide -Weight Management (SAXENDA) 18 MG/3ML SOPN is on backorder, checking to see if you have an alternative. Reference #  [id number]

## 2022-08-01 ENCOUNTER — Encounter: Payer: Self-pay | Admitting: Family Medicine

## 2022-08-01 NOTE — Telephone Encounter (Signed)
Was the prior auth for the saxenda done? They sent me a notice that the saxenda was on back order on 3/1 and I have that form, but she is asking if the prior auth for the [facility] was done. Thanks!

## 2022-08-01 NOTE — Telephone Encounter (Signed)
I have not received a request from the pharmacy for a prior auth on Saxenda.

## 2022-08-02 ENCOUNTER — Other Ambulatory Visit (HOSPITAL_COMMUNITY): Payer: Self-pay

## 2022-08-02 MED ORDER — WEGOVY 0.5 MG/0.5ML ~~LOC~~ SOAJ
0.5000 mg | SUBCUTANEOUS | 5 refills | Status: DC
  Filled 2022-08-02: qty 2, 28d supply, fill #0

## 2022-08-03 ENCOUNTER — Other Ambulatory Visit (HOSPITAL_COMMUNITY): Payer: Self-pay

## 2022-10-29 ENCOUNTER — Other Ambulatory Visit: Payer: Self-pay | Admitting: Family Medicine

## 2022-10-29 DIAGNOSIS — Z1231 Encounter for screening mammogram for malignant neoplasm of breast: Secondary | ICD-10-CM

## 2022-11-16 ENCOUNTER — Other Ambulatory Visit (HOSPITAL_COMMUNITY): Payer: Self-pay

## 2022-11-16 ENCOUNTER — Encounter: Payer: Self-pay | Admitting: Family Medicine

## 2022-11-16 ENCOUNTER — Ambulatory Visit (INDEPENDENT_AMBULATORY_CARE_PROVIDER_SITE_OTHER): Payer: BC Managed Care – PPO | Admitting: Family Medicine

## 2022-11-16 VITALS — BP 130/90 | HR 89 | Temp 98.3°F | Ht 62.5 in | Wt 215.4 lb

## 2022-11-16 DIAGNOSIS — R7303 Prediabetes: Secondary | ICD-10-CM | POA: Diagnosis not present

## 2022-11-16 DIAGNOSIS — E785 Hyperlipidemia, unspecified: Secondary | ICD-10-CM

## 2022-11-16 DIAGNOSIS — H6121 Impacted cerumen, right ear: Secondary | ICD-10-CM

## 2022-11-16 DIAGNOSIS — E039 Hypothyroidism, unspecified: Secondary | ICD-10-CM | POA: Diagnosis not present

## 2022-11-16 DIAGNOSIS — Z0000 Encounter for general adult medical examination without abnormal findings: Secondary | ICD-10-CM

## 2022-11-16 DIAGNOSIS — R110 Nausea: Secondary | ICD-10-CM

## 2022-11-16 LAB — COMPREHENSIVE METABOLIC PANEL
ALT: 11 U/L (ref 0–35)
AST: 12 U/L (ref 0–37)
Albumin: 4.4 g/dL (ref 3.5–5.2)
Alkaline Phosphatase: 72 U/L (ref 39–117)
BUN: 15 mg/dL (ref 6–23)
CO2: 24 mEq/L (ref 19–32)
Calcium: 9.9 mg/dL (ref 8.4–10.5)
Chloride: 102 mEq/L (ref 96–112)
Creatinine, Ser: 0.83 mg/dL (ref 0.40–1.20)
GFR: 80.96 mL/min (ref >60.00)
Glucose, Bld: 85 mg/dL (ref 70–99)
Potassium: 4 mEq/L (ref 3.5–5.1)
Sodium: 138 mEq/L (ref 135–145)
Total Bilirubin: 0.5 mg/dL (ref 0.2–1.2)
Total Protein: 7.7 g/dL (ref 6.0–8.3)

## 2022-11-16 LAB — LIPID PANEL
Cholesterol: 169 mg/dL (ref 0–200)
HDL: 50.9 mg/dL (ref >39.00)
LDL Cholesterol: 94 mg/dL (ref 0–99)
NonHDL: 118.3
Total CHOL/HDL Ratio: 3
Triglycerides: 124 mg/dL (ref 0.0–149.0)
VLDL: 24.8 mg/dL (ref 0.0–40.0)

## 2022-11-16 LAB — TSH: TSH: 1.69 u[IU]/mL (ref 0.35–5.50)

## 2022-11-16 LAB — HEMOGLOBIN A1C: Hgb A1c MFr Bld: 5.7 % (ref 4.6–6.5)

## 2022-11-16 MED ORDER — WEGOVY 2.4 MG/0.75ML ~~LOC~~ SOAJ
2.4000 mg | SUBCUTANEOUS | 5 refills | Status: DC
Start: 2022-11-16 — End: 2023-05-06
  Filled 2022-11-16: qty 3, 28d supply, fill #0
  Filled 2022-12-10: qty 3, 28d supply, fill #1
  Filled 2023-01-10: qty 3, 28d supply, fill #2
  Filled 2023-02-07 – 2023-02-12 (×2): qty 3, 28d supply, fill #3
  Filled 2023-03-12: qty 3, 28d supply, fill #4
  Filled 2023-04-05: qty 3, 28d supply, fill #5

## 2022-11-16 MED ORDER — ONDANSETRON HCL 4 MG PO TABS
4.0000 mg | ORAL_TABLET | Freq: Three times a day (TID) | ORAL | 2 refills | Status: AC | PRN
  Filled 2022-11-16: qty 18, 21d supply, fill #0

## 2022-11-16 NOTE — Patient Instructions (Signed)

## 2022-11-16 NOTE — Progress Notes (Signed)
Complete physical exam  Patient: Andrea Delgado   DOB: 1971-01-24   52 y.o. Female  MRN: [id number]  Subjective:    Chief Complaint  Patient presents with   Annual Exam    Fasted this morning.     Quiera Diffee is a 52 y.o. female who presents today for a complete physical exam. She reports consuming a general diet. Home exercise routine includes tries to walk around her house daily. She generally feels well. She reports sleeping well. She does not have additional problems to discuss today.    Most recent fall risk assessment:    11/16/2022    8:14 AM  Fall Risk   Falls in the past year? 0  Number falls in past yr: 0  Injury with Fall? 0  Risk for fall due to : No Fall Risks  Follow up Falls evaluation completed     Most recent depression screenings:    11/16/2022    8:14 AM 05/15/2022   12:56 PM  PHQ 2/9 Scores  PHQ - 2 Score 0 0  PHQ- 9 Score 0 0    Vision:Within last year and Dental: No current dental problems and Receives regular dental care  Patient Active Problem List   Diagnosis Date Noted   Prediabetes 05/15/2022   Hyperglycemia 01/16/2019   Recurrent major depressive disorder, in full remission (HCC) 06/17/2017   Hypothyroidism 04/27/2016   Hirsutism 10/08/2011   Eczema 10/08/2011   Morbid obesity (HCC) 08/18/2009   Hyperlipemia 05/05/2009      Patient Care Team: Karie Georges, MD as PCP - General (Family Medicine) Huel Cote, MD as Consulting Physician (Obstetrics and Gynecology)   Outpatient Medications Prior to Visit  Medication Sig   Ascorbic Acid (VITAMIN C PO) Take by mouth.   atorvastatin (LIPITOR) 20 MG tablet Take 1 tablet (20 mg total) by mouth daily.   levocetirizine (XYZAL) 5 MG tablet TAKE 1 TABLET EVERY EVENING   Multiple Vitamin (MULTIVITAMINS PO) Take by mouth.   sertraline (ZOLOFT) 100 MG tablet Take 1 tablet (100 mg total) by mouth daily.   spironolactone (ALDACTONE) 25 MG tablet Take 25 mg by mouth  daily.   SYNTHROID 75 MCG tablet    [DISCONTINUED] Semaglutide-Weight Management (WEGOVY) 0.5 MG/0.5ML SOAJ Inject 0.5 mg into the skin once a week.   [DISCONTINUED] mometasone (NASONEX) 50 MCG/ACT nasal spray Place 2 sprays into the nose daily.   No facility-administered medications prior to visit.    Review of Systems  All other systems reviewed and are negative.      Objective:     BP (!) 130/90 (BP Location: Left Arm, Patient Position: Sitting, Cuff Size: Large)   Pulse 89   Temp 98.3 F (36.8 C) (Oral)   Ht 5' 2.5" (1.588 m)   Wt 215 lb 6.4 oz (97.7 kg)   LMP 03/17/2022 (Approximate)   SpO2 97%   BMI 38.77 kg/m    Physical Exam Vitals reviewed.  Constitutional:      Appearance: Normal appearance. She is well-groomed. She is morbidly obese.  HENT:     Right Ear: Tympanic membrane normal. There is impacted cerumen.     Left Ear: Tympanic membrane normal.     Mouth/Throat:     Mouth: Mucous membranes are moist.     Pharynx: No posterior oropharyngeal erythema.  Eyes:     Conjunctiva/sclera: Conjunctivae normal.  Neck:     Thyroid: No thyromegaly.  Cardiovascular:     Rate and Rhythm: Normal  rate and regular rhythm.     Pulses: Normal pulses.     Heart sounds: S1 normal and S2 normal.  Pulmonary:     Effort: Pulmonary effort is normal.     Breath sounds: Normal breath sounds and air entry.  Abdominal:     General: Bowel sounds are normal.  Musculoskeletal:     Right lower leg: No edema.     Left lower leg: No edema.  Neurological:     Mental Status: She is alert and oriented to person, place, and time. Mental status is at baseline.     Gait: Gait is intact.  Psychiatric:        Mood and Affect: Mood and affect normal.        Speech: Speech normal.        Behavior: Behavior normal.        Judgment: Judgment normal.     Cerumen impaction Removal/ Irrigation note:  Patient was irrigated in the right ear canal with warm water mixed with hydrogen  peroxide. Reexamination showed loosening up of the impaction however it required manual removal with ear curette. This was done successfully, there was some mild bleeding in the ear canal but this was not significant. Right TM visualized and was normal.   No results found for any visits on 11/16/22.     Assessment & Plan:    Routine Health Maintenance and Physical Exam  Immunization History  Administered Date(s) Administered   Influenza,inj,Quad PF,6+ Mos 04/04/2021   PFIZER(Purple Top)SARS-COV-2 Vaccination 09/03/2019, 09/28/2019, 04/17/2020, 12/17/2020, 05/15/2021   Td 08/18/2009   Tdap 10/05/2020   Zoster Recombinat (Shingrix) 02/03/2022, 05/05/2022    Health Maintenance  Topic Date Due   COVID-19 Vaccine (6 - 2023-24 season) 12/02/2022 (Originally 01/26/2022)   PAP SMEAR-Modifier  02/16/2023 (Originally 02/26/2020)   Hepatitis C Screening  05/16/2023 (Originally 04/16/1988)   HIV Screening  01/05/2025 (Originally 04/16/1985)   INFLUENZA VACCINE  12/27/2022   Fecal DNA (Cologuard)  10/19/2023   MAMMOGRAM  02/22/2024   DTaP/Tdap/Td (3 - Td or Tdap) 10/06/2030   Zoster Vaccines- Shingrix  Completed   HPV VACCINES  Aged Out    Discussed health benefits of physical activity, and encouraged her to engage in regular exercise appropriate for her age and condition.  Routine general medical examination at a health care facility  - Normal physical exam findings today, labs ordered for annual surveillance. Handouts given on healthy eating and exercise, see below.   Morbid obesity (HCC) -     TFTDDU; Inject 2.4 mg into the skin once a week.  Dispense: 3 mL; Refill: 5 -     Comprehensive metabolic panel  Nausea -     Ondansetron HCl; Take 1 tablet (4 mg total) by mouth every 8 (eight) hours as needed for nausea or vomiting.  Dispense: 20 tablet; Refill: 2  Prediabetes -     Hemoglobin A1c  Hypothyroidism, unspecified type -     TSH  Hyperlipidemia, unspecified hyperlipidemia  type -     Lipid panel  Impacted cerumen of right ear  S/p Right EAC irrigation/ manual extraction of the impaction. Now resolved. Pt tolerated the procedure well. Recommended Debrox ear drops for prevention.   Return in 6 months (on 05/18/2023) for weight loss.     Karie Georges, MD

## 2022-12-07 DIAGNOSIS — E039 Hypothyroidism, unspecified: Secondary | ICD-10-CM | POA: Diagnosis not present

## 2022-12-07 DIAGNOSIS — L680 Hirsutism: Secondary | ICD-10-CM | POA: Diagnosis not present

## 2022-12-18 DIAGNOSIS — E785 Hyperlipidemia, unspecified: Secondary | ICD-10-CM | POA: Diagnosis not present

## 2022-12-18 DIAGNOSIS — L680 Hirsutism: Secondary | ICD-10-CM | POA: Diagnosis not present

## 2022-12-18 DIAGNOSIS — R7301 Impaired fasting glucose: Secondary | ICD-10-CM | POA: Diagnosis not present

## 2022-12-18 DIAGNOSIS — E039 Hypothyroidism, unspecified: Secondary | ICD-10-CM | POA: Diagnosis not present

## 2023-02-11 ENCOUNTER — Other Ambulatory Visit (HOSPITAL_COMMUNITY): Payer: Self-pay

## 2023-02-11 ENCOUNTER — Encounter: Payer: Self-pay | Admitting: Family Medicine

## 2023-02-12 ENCOUNTER — Other Ambulatory Visit (HOSPITAL_COMMUNITY): Payer: Self-pay

## 2023-02-12 NOTE — Telephone Encounter (Signed)
Noted  

## 2023-02-25 ENCOUNTER — Ambulatory Visit: Payer: BC Managed Care – PPO

## 2023-02-27 ENCOUNTER — Other Ambulatory Visit (HOSPITAL_COMMUNITY): Payer: Self-pay

## 2023-03-19 ENCOUNTER — Ambulatory Visit
Admission: RE | Admit: 2023-03-19 | Discharge: 2023-03-19 | Disposition: A | Discharge status: Home / Self Care | Payer: BC Managed Care – PPO | Source: Ambulatory Visit | Attending: Family Medicine | Admitting: Family Medicine

## 2023-03-19 DIAGNOSIS — Z1231 Encounter for screening mammogram for malignant neoplasm of breast: Secondary | ICD-10-CM | POA: Diagnosis not present

## 2023-05-02 ENCOUNTER — Other Ambulatory Visit: Payer: Self-pay | Admitting: Family Medicine

## 2023-05-02 DIAGNOSIS — E782 Mixed hyperlipidemia: Secondary | ICD-10-CM

## 2023-05-06 ENCOUNTER — Other Ambulatory Visit: Payer: Self-pay | Admitting: Family Medicine

## 2023-05-06 ENCOUNTER — Other Ambulatory Visit (HOSPITAL_COMMUNITY): Payer: Self-pay

## 2023-05-06 MED ORDER — WEGOVY 2.4 MG/0.75ML ~~LOC~~ SOAJ
2.4000 mg | SUBCUTANEOUS | 5 refills | Status: DC
Start: 2023-05-06 — End: 2023-05-17
  Filled 2023-05-06: qty 3, 28d supply, fill #0

## 2023-05-15 ENCOUNTER — Other Ambulatory Visit: Payer: Self-pay | Admitting: Family Medicine

## 2023-05-15 DIAGNOSIS — F3342 Major depressive disorder, recurrent, in full remission: Secondary | ICD-10-CM

## 2023-05-17 ENCOUNTER — Other Ambulatory Visit (HOSPITAL_COMMUNITY): Payer: Self-pay

## 2023-05-17 ENCOUNTER — Encounter: Payer: Self-pay | Admitting: Family Medicine

## 2023-05-17 ENCOUNTER — Ambulatory Visit: Payer: BC Managed Care – PPO | Admitting: Family Medicine

## 2023-05-17 VITALS — BP 122/80 | HR 87 | Temp 98.5°F | Ht 62.5 in | Wt 204.8 lb

## 2023-05-17 DIAGNOSIS — Z6836 Body mass index (BMI) 36.0-36.9, adult: Secondary | ICD-10-CM

## 2023-05-17 DIAGNOSIS — R7303 Prediabetes: Secondary | ICD-10-CM | POA: Diagnosis not present

## 2023-05-17 LAB — POCT GLYCOSYLATED HEMOGLOBIN (HGB A1C): Hemoglobin A1C: 5.5 % (ref 4.0–5.6)

## 2023-05-17 MED ORDER — ZEPBOUND 15 MG/0.5ML ~~LOC~~ SOAJ
15.0000 mg | SUBCUTANEOUS | 5 refills | Status: DC
  Filled 2023-05-17 – 2023-06-05 (×3): qty 2, 28d supply, fill #0
  Filled 2023-07-02: qty 2, 28d supply, fill #1
  Filled 2023-07-30: qty 2, 28d supply, fill #2
  Filled 2023-08-27: qty 2, 28d supply, fill #3
  Filled 2023-09-23: qty 2, 28d supply, fill #4
  Filled 2023-10-22: qty 2, 28d supply, fill #5

## 2023-05-17 NOTE — Progress Notes (Unsigned)
Established Patient Office Visit  Subjective   Patient ID: Andrea Delgado, female    DOB: 1970/09/02  Age: 52 y.o. MRN: [id number]  Chief Complaint  Patient presents with   Medical Management of Chronic Issues    Pt is here for weight loss follow up.  Patient has been on the maximum dose of the zepbound for the last month and she reports no significant side effects, has lost 11 pounds from previous visit. She reports she is a little disappointed that she hasn't lost more than that. We discussed that now that she is on the maximum therapeutic dose she should start to experience better results.     Current Outpatient Medications  Medication Instructions   Ascorbic Acid (VITAMIN C PO) Take by mouth.   atorvastatin (LIPITOR) 20 mg, Oral, Daily   levocetirizine (XYZAL) 5 MG tablet TAKE 1 TABLET EVERY EVENING   Multiple Vitamin (MULTIVITAMINS PO) Take by mouth.   ondansetron (ZOFRAN) 4 mg, Oral, Every 8 hours PRN   sertraline (ZOLOFT) 100 mg, Oral, Daily   spironolactone (ALDACTONE) 25 mg, Daily   SYNTHROID 75 MCG tablet No dose, route, or frequency recorded.   Zepbound 15 mg, Subcutaneous, Weekly    Patient Active Problem List   Diagnosis Date Noted   Prediabetes 05/15/2022   Hyperglycemia 01/16/2019   Recurrent major depressive disorder, in full remission (HCC) 06/17/2017   Hypothyroidism 04/27/2016   Hirsutism 10/08/2011   Eczema 10/08/2011   Morbid obesity (HCC) 08/18/2009   Hyperlipemia 05/05/2009      Review of Systems  All other systems reviewed and are negative.     Objective:     BP 122/80   Pulse 87   Temp 98.5 F (36.9 C) (Oral)   Ht 5' 2.5" (1.588 m)   Wt 204 lb 12.8 oz (92.9 kg)   SpO2 98%   BMI 36.86 kg/m    Physical Exam Vitals reviewed.  Constitutional:      Appearance: Normal appearance. She is well-groomed. She is morbidly obese.  Neck:     Thyroid: No thyromegaly.  Cardiovascular:     Rate and Rhythm: Normal rate and regular  rhythm.     Heart sounds: S1 normal and S2 normal.  Pulmonary:     Effort: Pulmonary effort is normal.     Breath sounds: Normal breath sounds and air entry.  Neurological:     Mental Status: She is alert and oriented to person, place, and time. Mental status is at baseline.     Gait: Gait is intact.  Psychiatric:        Mood and Affect: Mood and affect normal.        Speech: Speech normal.        Behavior: Behavior normal.      Results for orders placed or performed in visit on 05/17/23  POC HgB A1c  Result Value Ref Range   Hemoglobin A1C 5.5 4.0 - 5.6 %   HbA1c POC (<> result, manual entry)     HbA1c, POC (prediabetic range)     HbA1c, POC (controlled diabetic range)        The 10-year ASCVD risk score (Arnett DK, et al., 2019) is: 1.8%    Assessment & Plan:  Prediabetes Assessment & Plan: A1C down to 5.5 on the zepbound, will continue to monitor every 6 months  Orders: -     POCT glycosylated hemoglobin (Hb A1C)  Morbid obesity (HCC) Assessment & Plan: Improving on the zepbound, will continue  15 mg weekly dosing. I counseled the patient today and reinforced the goals she was previously given.   Orders: -     Zepbound; Inject 15 mg into the skin once a week.  Dispense: 2 mL; Refill: 5     Return in about 6 months (around 11/15/2023) for annual physical exam.    Karie Georges, MD

## 2023-05-23 NOTE — Assessment & Plan Note (Signed)
Improving on the zepbound, will continue 15 mg weekly dosing. I counseled the patient today and reinforced the goals she was previously given.

## 2023-05-23 NOTE — Assessment & Plan Note (Signed)
A1C down to 5.5 on the zepbound, will continue to monitor every 6 months

## 2023-05-29 ENCOUNTER — Encounter: Payer: Self-pay | Admitting: Family Medicine

## 2023-05-30 ENCOUNTER — Telehealth: Payer: Self-pay

## 2023-05-30 ENCOUNTER — Other Ambulatory Visit (HOSPITAL_COMMUNITY): Payer: Self-pay

## 2023-05-30 DIAGNOSIS — Z1151 Encounter for screening for human papillomavirus (HPV): Secondary | ICD-10-CM | POA: Diagnosis not present

## 2023-05-30 DIAGNOSIS — Z130 Encounter for screening for diseases of the blood and blood-forming organs and certain disorders involving the immune mechanism: Secondary | ICD-10-CM | POA: Diagnosis not present

## 2023-05-30 DIAGNOSIS — Z01411 Encounter for gynecological examination (general) (routine) with abnormal findings: Secondary | ICD-10-CM | POA: Diagnosis not present

## 2023-05-30 DIAGNOSIS — N951 Menopausal and female climacteric states: Secondary | ICD-10-CM | POA: Diagnosis not present

## 2023-05-30 DIAGNOSIS — Z124 Encounter for screening for malignant neoplasm of cervix: Secondary | ICD-10-CM | POA: Diagnosis not present

## 2023-05-30 LAB — HM PAP SMEAR: HPV, high-risk: NEGATIVE

## 2023-05-30 NOTE — Telephone Encounter (Signed)
 PA request has been Started for Zepbound 15 mg. New Encounter created for follow up. For additional info see Pharmacy Prior Auth telephone encounter from 05/30/23.

## 2023-06-03 ENCOUNTER — Other Ambulatory Visit (HOSPITAL_COMMUNITY): Payer: Self-pay

## 2023-06-03 ENCOUNTER — Telehealth: Payer: Self-pay

## 2023-06-03 NOTE — Telephone Encounter (Signed)
 Pharmacy Patient Advocate Encounter  Received notification from [facility] that Prior Authorization for Zepbound  15MG /0.5ML pen-injectors  has been APPROVED from 06/03/2023 to 06/02/2024. Ran test claim, Copay is $24.99. This test claim was processed through [facility]- copay amounts may vary at other pharmacies due to pharmacy/plan contracts, or as the patient moves through the different stages of their insurance plan.   PA #/Case ID/Reference #: [id number]

## 2023-06-05 ENCOUNTER — Other Ambulatory Visit (HOSPITAL_COMMUNITY): Payer: Self-pay

## 2023-06-09 ENCOUNTER — Encounter: Payer: Self-pay | Admitting: Family Medicine

## 2023-07-26 ENCOUNTER — Other Ambulatory Visit: Payer: Self-pay | Admitting: Family Medicine

## 2023-07-26 DIAGNOSIS — F3342 Major depressive disorder, recurrent, in full remission: Secondary | ICD-10-CM

## 2023-08-08 DIAGNOSIS — N926 Irregular menstruation, unspecified: Secondary | ICD-10-CM | POA: Diagnosis not present

## 2023-08-08 DIAGNOSIS — N950 Postmenopausal bleeding: Secondary | ICD-10-CM | POA: Diagnosis not present

## 2023-08-16 ENCOUNTER — Other Ambulatory Visit (HOSPITAL_COMMUNITY): Payer: Self-pay

## 2023-08-16 MED ORDER — MISOPROSTOL 200 MCG PO TABS
400.0000 ug | ORAL_TABLET | ORAL | 0 refills | Status: DC | PRN
Start: 2023-08-16 — End: 2023-11-22
  Filled 2023-08-16: qty 2, 1d supply, fill #0

## 2023-08-29 DIAGNOSIS — N950 Postmenopausal bleeding: Secondary | ICD-10-CM | POA: Diagnosis not present

## 2023-09-03 DIAGNOSIS — E039 Hypothyroidism, unspecified: Secondary | ICD-10-CM | POA: Diagnosis not present

## 2023-09-05 ENCOUNTER — Encounter: Payer: Self-pay | Admitting: Family Medicine

## 2023-09-24 DIAGNOSIS — N840 Polyp of corpus uteri: Secondary | ICD-10-CM | POA: Diagnosis not present

## 2023-10-17 ENCOUNTER — Other Ambulatory Visit: Payer: Self-pay | Admitting: Family Medicine

## 2023-10-17 DIAGNOSIS — E782 Mixed hyperlipidemia: Secondary | ICD-10-CM

## 2023-11-11 ENCOUNTER — Other Ambulatory Visit: Payer: Self-pay | Admitting: Family Medicine

## 2023-11-11 DIAGNOSIS — H25813 Combined forms of age-related cataract, bilateral: Secondary | ICD-10-CM | POA: Diagnosis not present

## 2023-11-11 DIAGNOSIS — F3342 Major depressive disorder, recurrent, in full remission: Secondary | ICD-10-CM

## 2023-11-18 ENCOUNTER — Other Ambulatory Visit: Payer: Self-pay | Admitting: Family Medicine

## 2023-11-18 ENCOUNTER — Encounter: Payer: Self-pay | Admitting: Family Medicine

## 2023-11-18 ENCOUNTER — Other Ambulatory Visit (HOSPITAL_COMMUNITY): Payer: Self-pay

## 2023-11-18 MED ORDER — ZEPBOUND 15 MG/0.5ML ~~LOC~~ SOAJ
15.0000 mg | SUBCUTANEOUS | 5 refills | Status: DC
  Filled 2023-11-18: qty 2, 28d supply, fill #0

## 2023-11-22 ENCOUNTER — Encounter: Payer: Self-pay | Admitting: Family Medicine

## 2023-11-22 ENCOUNTER — Ambulatory Visit (INDEPENDENT_AMBULATORY_CARE_PROVIDER_SITE_OTHER): Admitting: Family Medicine

## 2023-11-22 VITALS — BP 104/80 | HR 95 | Temp 98.3°F | Ht 63.0 in | Wt 189.0 lb

## 2023-11-22 DIAGNOSIS — Z0000 Encounter for general adult medical examination without abnormal findings: Secondary | ICD-10-CM | POA: Diagnosis not present

## 2023-11-22 DIAGNOSIS — Z1211 Encounter for screening for malignant neoplasm of colon: Secondary | ICD-10-CM

## 2023-11-22 DIAGNOSIS — E785 Hyperlipidemia, unspecified: Secondary | ICD-10-CM | POA: Diagnosis not present

## 2023-11-22 DIAGNOSIS — R7303 Prediabetes: Secondary | ICD-10-CM

## 2023-11-22 LAB — COMPREHENSIVE METABOLIC PANEL WITH GFR
ALT: 16 U/L (ref 0–35)
AST: 19 U/L (ref 0–37)
Albumin: 4.9 g/dL (ref 3.5–5.2)
Alkaline Phosphatase: 64 U/L (ref 39–117)
BUN: 17 mg/dL (ref 6–23)
CO2: 24 meq/L (ref 19–32)
Calcium: 10 mg/dL (ref 8.4–10.5)
Chloride: 102 meq/L (ref 96–112)
Creatinine, Ser: 0.92 mg/dL (ref 0.40–1.20)
GFR: 71.04 mL/min (ref >60.00)
Glucose, Bld: 96 mg/dL (ref 70–99)
Potassium: 4.2 meq/L (ref 3.5–5.1)
Sodium: 135 meq/L (ref 135–145)
Total Bilirubin: 0.5 mg/dL (ref 0.2–1.2)
Total Protein: 8 g/dL (ref 6.0–8.3)

## 2023-11-22 LAB — LIPID PANEL
Cholesterol: 213 mg/dL — ABNORMAL HIGH (ref 0–200)
HDL: 61.8 mg/dL (ref >39.00)
LDL Cholesterol: 125 mg/dL — ABNORMAL HIGH (ref 0–99)
NonHDL: 151.32
Total CHOL/HDL Ratio: 3
Triglycerides: 132 mg/dL (ref 0.0–149.0)
VLDL: 26.4 mg/dL (ref 0.0–40.0)

## 2023-11-22 LAB — HEMOGLOBIN A1C: Hgb A1c MFr Bld: 5.6 % (ref 4.6–6.5)

## 2023-11-22 NOTE — Progress Notes (Signed)
 Complete physical exam  Patient: Andrea Delgado   DOB: [birth year]   53 y.o. Female  MRN: [id number]  Subjective:    Chief Complaint  Patient presents with   Annual Exam    Andrea Delgado is a 53 y.o. female who presents today for a complete physical exam. She reports consuming a high protein low carb, states that she should eat more veggies diet. The patient does not participate in regular exercise at present. She generally feels well. She reports sleeping fairly well. She does not have additional problems to discuss today.   Patient was contacted by her insurance that they will no longer be covering her Zepbound . Patient reports she has been on Wegovy  in the past, states that she was having side effects. Side effects were nausea/ vomiting, acid reflux and constipation.  The decision to switch to Zepbound  was because of this and also the lack of achievement of appropriate weight loss (pt was on 2.4 of wegovy  and only lost 11 pounds in 6 months).  Since being on the Zepbound  her side effects have resolved and she is consistently losing weight appropriately.   Most recent fall risk assessment:    11/16/2022    8:14 AM  Fall Risk   Falls in the past year? 0  Number falls in past yr: 0  Injury with Fall? 0  Risk for fall due to : No Fall Risks  Follow up Falls evaluation completed     Most recent depression screenings:    11/22/2023    9:26 AM 05/17/2023    3:47 PM  PHQ 2/9 Scores  PHQ - 2 Score 0 0  PHQ- 9 Score 0 0    Vision:Within last year and Dental: No current dental problems and Receives regular dental care  Patient Active Problem List   Diagnosis Date Noted   Prediabetes 05/15/2022   Hyperglycemia 01/16/2019   Recurrent major depressive disorder, in full remission (HCC) 06/17/2017   Hypothyroidism 04/27/2016   Hirsutism 10/08/2011   Eczema 10/08/2011   Morbid obesity (HCC) 08/18/2009   Hyperlipemia 05/05/2009      Patient Care Team: Ozell Heron HERO, MD as PCP - General (Family Medicine) Estelle Service, MD as Consulting Physician (Obstetrics and Gynecology)   Outpatient Medications Prior to Visit  Medication Sig   Ascorbic Acid (VITAMIN C PO) Take by mouth.   atorvastatin  (LIPITOR) 20 MG tablet TAKE 1 TABLET DAILY   levocetirizine (XYZAL ) 5 MG tablet TAKE 1 TABLET EVERY EVENING   Multiple Vitamin (MULTIVITAMINS PO) Take by mouth.   ondansetron  (ZOFRAN ) 4 MG tablet Take 1 tablet (4 mg total) by mouth every 8 (eight) hours as needed for nausea or vomiting.   sertraline  (ZOLOFT ) 100 MG tablet TAKE 1 TABLET DAILY   spironolactone (ALDACTONE) 25 MG tablet Take 25 mg by mouth daily.   SYNTHROID 75 MCG tablet    tirzepatide  (ZEPBOUND ) 15 MG/0.5ML Pen Inject 15 mg into the skin once a week.   [DISCONTINUED] misoprostol  (CYTOTEC ) 200 MCG tablet Place 2 tablets (400 mcg total) vaginally 3 hours prior to appointment. Push in as far as you can.   No facility-administered medications prior to visit.    Review of Systems  HENT:  Negative for hearing loss.   Eyes:  Negative for blurred vision.  Respiratory:  Negative for shortness of breath.   Cardiovascular:  Negative for chest pain.  Gastrointestinal: Negative.   Genitourinary: Negative.   Musculoskeletal:  Negative for back pain.  Neurological:  Negative for headaches.  Psychiatric/Behavioral:  Negative for depression.        Objective:     BP 104/80   Pulse 95   Temp 98.3 F (36.8 C) (Oral)   Ht 5' 3 (1.6 m)   Wt 189 lb (85.7 kg)   LMP 11/22/2022   SpO2 97%   BMI 33.48 kg/m    Physical Exam Vitals reviewed.  Constitutional:      Appearance: Normal appearance. She is well-groomed. She is obese.  HENT:     Right Ear: There is impacted cerumen (not completely impacted, but cannot see the TM).     Left Ear: Tympanic membrane and ear canal normal.     Mouth/Throat:     Mouth: Mucous membranes are moist.     Pharynx: No posterior oropharyngeal erythema.    Eyes:     Conjunctiva/sclera: Conjunctivae normal.   Neck:     Thyroid : No thyromegaly.   Cardiovascular:     Rate and Rhythm: Normal rate and regular rhythm.     Pulses: Normal pulses.     Heart sounds: S1 normal and S2 normal.  Pulmonary:     Effort: Pulmonary effort is normal.     Breath sounds: Normal breath sounds and air entry.  Abdominal:     General: Abdomen is flat. Bowel sounds are normal.     Palpations: Abdomen is soft.   Musculoskeletal:     Right lower leg: No edema.     Left lower leg: No edema.  Lymphadenopathy:     Cervical: No cervical adenopathy.   Neurological:     Mental Status: She is alert and oriented to person, place, and time. Mental status is at baseline.     Gait: Gait is intact.   Psychiatric:        Mood and Affect: Mood and affect normal.        Speech: Speech normal.        Behavior: Behavior normal.        Judgment: Judgment normal.      No results found for any visits on 11/22/23.     Assessment & Plan:    Routine Health Maintenance and Physical Exam  Immunization History  Administered Date(s) Administered   Influenza,inj,Quad PF,6+ Mos 04/04/2021   Influenza-Unspecified 02/26/2023   Moderna Covid-19 Fall Seasonal Vaccine 69yrs & older 02/26/2023   PFIZER(Purple Top)SARS-COV-2 Vaccination 09/03/2019, 09/28/2019, 04/17/2020, 12/17/2020, 05/15/2021   Td 08/18/2009   Tdap 10/05/2020   Zoster Recombinant(Shingrix) 02/03/2022, 05/05/2022    Health Maintenance  Topic Date Due   Hepatitis B Vaccines (1 of 3 - 19+ 3-dose series) Never done   Cervical Cancer Screening (HPV/Pap Cotest)  05/28/2013   Fecal DNA (Cologuard)  10/19/2023   Hepatitis C Screening  05/16/2024 (Originally 04/16/1988)   HIV Screening  01/05/2025 (Originally 04/16/1985)   INFLUENZA VACCINE  12/27/2023   MAMMOGRAM  03/18/2025   DTaP/Tdap/Td (3 - Td or Tdap) 10/06/2030   COVID-19 Vaccine  Completed   Zoster Vaccines- Shingrix  Completed   HPV VACCINES   Aged Out   Meningococcal B Vaccine  Aged Out    Discussed health benefits of physical activity, and encouraged her to engage in regular exercise appropriate for her age and condition.  Hyperlipidemia, unspecified hyperlipidemia type -     Lipid panel; Future  Colon cancer screening -     Cologuard  Prediabetes -     Comprehensive metabolic panel with GFR; Future -     Hemoglobin  A1c; Future  Routine general medical examination at a health care facility  Normal physical exam findings. I counseled the patient on the recommended amount of exercise per CDC recommendation. I encouraged her/ gave examples to find ways to mae exercise fun and engaging. I also counseled patient about ear irrigation at home and using Debroz ear drops to help with wax buildup.  I reviewed preventative screening, immunizations, and medical history and updated in the chart, and appropriate labs and vaccinations were ordered. Handouts given on healthy eating and exercise.    Return in 6 months (on 05/23/2024).     Heron CHRISTELLA Sharper, MD

## 2023-11-22 NOTE — Patient Instructions (Signed)

## 2023-11-27 ENCOUNTER — Encounter: Payer: Self-pay | Admitting: Family Medicine

## 2023-11-27 ENCOUNTER — Other Ambulatory Visit (HOSPITAL_COMMUNITY): Payer: Self-pay

## 2023-11-27 ENCOUNTER — Telehealth: Payer: Self-pay

## 2023-11-27 NOTE — Telephone Encounter (Signed)
 Pharmacy Patient Advocate Encounter   Received notification from CoverMyMeds that prior authorization for Zepbound  15 is required/requested.   Insurance verification completed.   The patient is insured through [facility] .   Per test claim: PA required; PA submitted to above mentioned insurance via CoverMyMeds Key/confirmation #/EOC [id number] Status is pending

## 2023-11-28 ENCOUNTER — Other Ambulatory Visit (HOSPITAL_COMMUNITY): Payer: Self-pay

## 2023-11-28 NOTE — Telephone Encounter (Signed)
 Pharmacy Patient Advocate Encounter  Received notification from [facility] that Prior Authorization for Zepbound  15 has been DENIED.  Full denial letter will be uploaded to the media tab. See denial reason below.   PA #/Case ID/Reference #: [id number]

## 2023-11-29 DIAGNOSIS — Z1211 Encounter for screening for malignant neoplasm of colon: Secondary | ICD-10-CM | POA: Diagnosis not present

## 2023-12-02 ENCOUNTER — Telehealth: Payer: Self-pay

## 2023-12-02 ENCOUNTER — Other Ambulatory Visit (HOSPITAL_COMMUNITY): Payer: Self-pay

## 2023-12-02 NOTE — Telephone Encounter (Signed)
 Pharmacy Patient Advocate Encounter   Received notification from Pt Calls Messages that prior authorization for Zepbound  15 is required/requested.   Insurance verification completed.   The patient is insured through [facility] .   Per test claim: PA required; PA submitted to above mentioned insurance via CoverMyMeds Key/confirmation #/EOC [id number] Status is pending

## 2023-12-03 NOTE — Telephone Encounter (Signed)
 I see that the zepbound  was denied-- I documented that she had side effects on Wegovy  and also a suboptimal weight loss with that medication-- is there a way to get them to make an exception for her since she already failed wegovy ?

## 2023-12-03 NOTE — Telephone Encounter (Signed)
 Answered other message, ok to close

## 2023-12-04 ENCOUNTER — Telehealth: Payer: Self-pay

## 2023-12-04 NOTE — Telephone Encounter (Signed)
 Pharmacy Patient Advocate Encounter   Received notification from Patient Advice Request messages that prior authorization for ZEPBOUND  is required/requested.   Insurance verification completed.   The patient is insured through [facility] .   Per test claim: PA required; PA submitted to above mentioned insurance via CoverMyMeds Key/confirmation #/EOC [id number] Status is pending    Resubmitted PA stating why patient cannot take the preferred medications (Wegovy  and Mounjaro ).

## 2023-12-05 NOTE — Telephone Encounter (Signed)
 Per patient message on 11/27/23, appeal is requested.   Information has been sent to clinical pharmacist for appeals review. It may take 5-7 days to prepare the necessary documentation to request the appeal from the insurance.

## 2023-12-05 NOTE — Telephone Encounter (Signed)
 Mychart message sent to patient with information as below.

## 2023-12-05 NOTE — Telephone Encounter (Signed)
 Pharmacy Patient Advocate Encounter  Received notification from [facility] that Prior Authorization for ZEPBOUND  has been CANCELLED due to:  Duplicate PA cannot be processed   PA #/Case ID/Reference #: [id number]

## 2023-12-06 ENCOUNTER — Ambulatory Visit: Payer: Self-pay | Admitting: Family Medicine

## 2023-12-06 ENCOUNTER — Other Ambulatory Visit (HOSPITAL_COMMUNITY): Payer: Self-pay

## 2023-12-06 ENCOUNTER — Telehealth: Payer: Self-pay | Admitting: Pharmacist

## 2023-12-06 LAB — COLOGUARD: COLOGUARD: NEGATIVE

## 2023-12-06 NOTE — Telephone Encounter (Signed)
 Appeal has been submitted for Zepbound . Will advise when response is received, please be advised that most companies may take 30 days to make a decision. Appeal letter for Zepbound  and supporting documentation have been faxed to [phone] on 12/06/2023 @2 :52 pm.  Thank you, Devere Pandy, PharmD Clinical Pharmacist  [place]  Direct Dial: [phone]

## 2023-12-12 ENCOUNTER — Telehealth: Payer: Self-pay | Admitting: Pharmacist

## 2023-12-12 ENCOUNTER — Other Ambulatory Visit (HOSPITAL_BASED_OUTPATIENT_CLINIC_OR_DEPARTMENT_OTHER): Payer: Self-pay

## 2023-12-12 DIAGNOSIS — R7303 Prediabetes: Secondary | ICD-10-CM

## 2023-12-12 MED ORDER — MOUNJARO 15 MG/0.5ML ~~LOC~~ SOAJ: 15.0000 mg | SUBCUTANEOUS | 3 refills | Status: AC

## 2023-12-12 NOTE — Telephone Encounter (Signed)
 Sent script for mounjaro  to [facility]

## 2023-12-12 NOTE — Addendum Note (Signed)
 Addended by: Khushi Zupko M on: 12/12/2023 04:05 PM   Modules accepted: Orders

## 2023-12-12 NOTE — Telephone Encounter (Signed)
 Please note that the patient's insurance no longer covers Zepbound . However, because the patient has previously failed therapy with Wegovy , the insurer has approved an exception to allow a switch to Mounjaro , despite the patient not having type 2 diabetes. Mounjaro  contains the same active compound as Zepbound  and is covered under the patient's current plan. A new prescription for Mounjaro  will need to be submitted to the pharmacy.  The full insurance approval letter has been uploaded under the Media tab in the patient's chart for reference.  Please let me know if you have any questions.  Thank you, Devere Pandy, PharmD Clinical Pharmacist  [place]  Direct Dial: [phone]

## 2023-12-18 DIAGNOSIS — L680 Hirsutism: Secondary | ICD-10-CM | POA: Diagnosis not present

## 2023-12-18 DIAGNOSIS — E039 Hypothyroidism, unspecified: Secondary | ICD-10-CM | POA: Diagnosis not present

## 2023-12-25 DIAGNOSIS — R7301 Impaired fasting glucose: Secondary | ICD-10-CM | POA: Diagnosis not present

## 2023-12-25 DIAGNOSIS — E785 Hyperlipidemia, unspecified: Secondary | ICD-10-CM | POA: Diagnosis not present

## 2023-12-25 DIAGNOSIS — L680 Hirsutism: Secondary | ICD-10-CM | POA: Diagnosis not present

## 2023-12-25 DIAGNOSIS — E039 Hypothyroidism, unspecified: Secondary | ICD-10-CM | POA: Diagnosis not present

## 2024-01-01 ENCOUNTER — Other Ambulatory Visit (HOSPITAL_COMMUNITY): Payer: Self-pay

## 2024-02-03 ENCOUNTER — Other Ambulatory Visit: Payer: Self-pay | Admitting: Family Medicine

## 2024-02-03 DIAGNOSIS — Z1231 Encounter for screening mammogram for malignant neoplasm of breast: Secondary | ICD-10-CM

## 2024-02-10 ENCOUNTER — Other Ambulatory Visit: Payer: Self-pay | Admitting: Family Medicine

## 2024-02-10 DIAGNOSIS — F3342 Major depressive disorder, recurrent, in full remission: Secondary | ICD-10-CM

## 2024-03-20 ENCOUNTER — Ambulatory Visit
Admission: RE | Admit: 2024-03-20 | Discharge: 2024-03-20 | Disposition: A | Discharge status: Home / Self Care | Source: Ambulatory Visit | Attending: Family Medicine | Admitting: Family Medicine

## 2024-03-20 DIAGNOSIS — Z1231 Encounter for screening mammogram for malignant neoplasm of breast: Secondary | ICD-10-CM

## 2024-03-25 ENCOUNTER — Ambulatory Visit: Payer: Self-pay | Admitting: Family Medicine

## 2024-04-19 ENCOUNTER — Other Ambulatory Visit: Payer: Self-pay | Admitting: Family Medicine

## 2024-04-19 DIAGNOSIS — E782 Mixed hyperlipidemia: Secondary | ICD-10-CM

## 2024-05-09 ENCOUNTER — Other Ambulatory Visit: Payer: Self-pay | Admitting: Family Medicine

## 2024-05-09 DIAGNOSIS — F3342 Major depressive disorder, recurrent, in full remission: Secondary | ICD-10-CM

## 2024-05-25 ENCOUNTER — Ambulatory Visit: Admitting: Family Medicine

## 2024-06-05 ENCOUNTER — Other Ambulatory Visit (HOSPITAL_COMMUNITY): Payer: Self-pay

## 2024-11-23 ENCOUNTER — Encounter: Admitting: Family Medicine
